# Patient Record
Sex: Female | Born: 1935 | Race: White | Hispanic: No | Smoking: Never smoker
Health system: Southern US, Community
[De-identification: ages and names within clinical notes are randomized; demographics above are authoritative.]

## PROBLEM LIST (undated history)

## (undated) DIAGNOSIS — I10 Essential (primary) hypertension: Secondary | ICD-10-CM

## (undated) DIAGNOSIS — Z9989 Dependence on other enabling machines and devices: Secondary | ICD-10-CM

## (undated) DIAGNOSIS — Z972 Presence of dental prosthetic device (complete) (partial): Secondary | ICD-10-CM

## (undated) DIAGNOSIS — Z973 Presence of spectacles and contact lenses: Secondary | ICD-10-CM

---

## 1992-10-01 HISTORY — PX: HIP SURGERY: SHX245

## 2016-05-09 ENCOUNTER — Other Ambulatory Visit: Payer: Self-pay | Admitting: Endocrinology

## 2016-05-09 DIAGNOSIS — I348 Other nonrheumatic mitral valve disorders: Secondary | ICD-10-CM

## 2016-05-09 DIAGNOSIS — I3489 Other nonrheumatic mitral valve disorders: Secondary | ICD-10-CM

## 2016-05-21 ENCOUNTER — Ambulatory Visit
Admission: RE | Admit: 2016-05-21 | Discharge: 2016-05-21 | Disposition: A | Discharge status: Home / Self Care | Payer: Medicare Other | Source: Ambulatory Visit | Attending: Endocrinology | Admitting: Endocrinology

## 2016-05-21 DIAGNOSIS — I3489 Other nonrheumatic mitral valve disorders: Secondary | ICD-10-CM

## 2016-05-21 DIAGNOSIS — I348 Other nonrheumatic mitral valve disorders: Secondary | ICD-10-CM | POA: Insufficient documentation

## 2016-05-21 NOTE — Progress Notes (Signed)
*  PRELIMINARY RESULTS* Echocardiogram 2D Echocardiogram has been performed.  Cristela BlueHege, Gabriell Daigneault 05/21/2016, 10:38 AM

## 2017-02-22 ENCOUNTER — Encounter: Payer: Self-pay | Admitting: Emergency Medicine

## 2017-02-22 ENCOUNTER — Emergency Department
Admission: EM | Admit: 2017-02-22 | Discharge: 2017-02-22 | Disposition: A | Discharge status: Home / Self Care | Payer: Medicare Other | Attending: Emergency Medicine | Admitting: Emergency Medicine

## 2017-02-22 ENCOUNTER — Emergency Department: Payer: Medicare Other

## 2017-02-22 DIAGNOSIS — M79601 Pain in right arm: Secondary | ICD-10-CM | POA: Diagnosis not present

## 2017-02-22 DIAGNOSIS — R531 Weakness: Secondary | ICD-10-CM

## 2017-02-22 DIAGNOSIS — R5383 Other fatigue: Secondary | ICD-10-CM | POA: Diagnosis not present

## 2017-02-22 LAB — COMPREHENSIVE METABOLIC PANEL
ALBUMIN: 3.9 g/dL (ref 3.5–5.0)
ALT: 15 U/L (ref 14–54)
AST: 28 U/L (ref 15–41)
Alkaline Phosphatase: 78 U/L (ref 38–126)
Anion gap: 4 — ABNORMAL LOW (ref 5–15)
BUN: 13 mg/dL (ref 6–20)
CHLORIDE: 104 mmol/L (ref 101–111)
CO2: 27 mmol/L (ref 22–32)
CREATININE: 0.53 mg/dL (ref 0.44–1.00)
Calcium: 9.5 mg/dL (ref 8.9–10.3)
GFR calc non Af Amer: >60 mL/min (ref >60)
Glucose, Bld: 113 mg/dL — ABNORMAL HIGH (ref 65–99)
Potassium: 3.6 mmol/L (ref 3.5–5.1)
SODIUM: 135 mmol/L (ref 135–145)
Total Bilirubin: 1.8 mg/dL — ABNORMAL HIGH (ref 0.3–1.2)
Total Protein: 7.6 g/dL (ref 6.5–8.1)

## 2017-02-22 LAB — URINALYSIS, COMPLETE (UACMP) WITH MICROSCOPIC
BILIRUBIN URINE: NEGATIVE
GLUCOSE, UA: NEGATIVE mg/dL
KETONES UR: 5 mg/dL — AB
Leukocytes, UA: NEGATIVE
NITRITE: NEGATIVE
PH: 6 (ref 5.0–8.0)
Protein, ur: NEGATIVE mg/dL
Specific Gravity, Urine: 1.005 (ref 1.005–1.030)

## 2017-02-22 LAB — CBC
HCT: 40.3 % (ref 35.0–47.0)
Hemoglobin: 13.5 g/dL (ref 12.0–16.0)
MCH: 29.8 pg (ref 26.0–34.0)
MCHC: 33.6 g/dL (ref 32.0–36.0)
MCV: 88.7 fL (ref 80.0–100.0)
PLATELETS: 168 10*3/uL (ref 150–440)
RBC: 4.55 MIL/uL (ref 3.80–5.20)
RDW: 13.6 % (ref 11.5–14.5)
WBC: 4 10*3/uL (ref 3.6–11.0)

## 2017-02-22 LAB — TROPONIN I: Troponin I: <0.03 ng/mL (ref <0.03)

## 2017-02-22 MED ORDER — SODIUM CHLORIDE 0.9 % IV BOLUS (SEPSIS)
1000.0000 mL | Freq: Once | INTRAVENOUS | Status: AC
  Administered 2017-02-22: 1000 mL via INTRAVENOUS

## 2017-02-22 NOTE — ED Provider Notes (Signed)
-----------------------------------------   8:06 AM on 02/22/2017 -----------------------------------------  Was signed out to be 7:15 this morning, has been suffering from a feeling of dehydration, she has no symptoms. She feels fine. Per signout if her urinalysis is reassuring she is to be discharged. Her family are very eager to go home and requesting discharge at this time. She has no complaints and she is tolerating by mouth. Given this history, we did check her urine, which is reassuring. We will send a culture as a precaution. Patient otherwise is well-appearing. She has no complaints would like to go home. Return precautions and follow-up given and understood.   Jeanmarie PlantMcShane, Navina Wohlers A, MD 02/22/17 [phone]

## 2017-02-22 NOTE — ED Notes (Signed)
Patient reports that recently she is not eating and drinking a lot. Patient reports she does not have an appetite.

## 2017-02-22 NOTE — ED Triage Notes (Signed)
Pt states she has had no appetite for years, states has been feeling weak and shaky today.

## 2017-02-22 NOTE — ED Notes (Signed)
Assisted pt to toilet. Collected urine specimen.

## 2017-02-22 NOTE — ED Provider Notes (Signed)
[facility] Emergency Department Provider Note  Time seen: 4:53 AM  I have reviewed the triage vital signs and the nursing notes.   HISTORY  Chief Complaint Weakness    HPI Denise Escobar is a 83 y.o. female who presents to the emergency department for a fatigue and weakness. According to the patient for the past several days she has been feeling somewhat more fatigued and weak. She also states for the past week she has been expressing intermittent right arm pain although denies any currently. Otherwise the patient appears well with no other complaints at this time. Denies any chest pain or trouble breathing at any point. Denies any abdominal pain nausea vomiting diarrhea cough congestion or fever. Patient states she has not been eating or drinking as much recently and believe she could be dehydrated.  No past medical history on file.  There are no active problems to display for this patient.   No past surgical history on file.  Prior to Admission medications   Not on File    No Known Allergies  No family history on file.  Social History Social History  Substance Use Topics  . Smoking status: Not on file  . Smokeless tobacco: Not on file  . Alcohol use Not on file    Review of Systems Constitutional: Negative for fever.Positive for generalized weakness.  Eyes: Negative for visual changes. ENT: Negative for congestion Cardiovascular: Negative for chest pain. Respiratory: Negative for shortness of breath. Gastrointestinal: Negative for abdominal pain, vomiting and diarrhea. Genitourinary: Negative for dysuria. Musculoskeletal: Negative for back pain. Skin: Negative for rash. Neurological: Negative for headache All other ROS negative  ____________________________________________   PHYSICAL EXAM:  VITAL SIGNS: ED Triage Vitals  Enc Vitals Group     BP 02/22/17 0108 (!) 158/90     Pulse Rate 02/22/17 0108 (!) 102     Resp 02/22/17 0108  20     Temp 02/22/17 0108 98.3 F (36.8 C)     Temp Source 02/22/17 0108 Oral     SpO2 02/22/17 0108 95 %     Weight 02/22/17 0108 90 lb (40.8 kg)     Height --      Head Circumference --      Peak Flow --      Pain Score 02/22/17 0107 7     Pain Loc --      Pain Edu? --      Excl. in GC? --     Constitutional: Alert. Well appearing and in no distress.Friendly and pleasant. Eyes: Normal exam ENT   Head: Normocephalic and atraumatic.   Mouth/Throat: Mucous membranes are moist. Cardiovascular: Normal rate, regular rhythm. No murmur Respiratory: Normal respiratory effort without tachypnea nor retractions. Breath sounds are clear Gastrointestinal: Soft and nontender. No distention. Musculoskeletal: Nontender with normal range of motion in all extremities. No lower extremity tenderness or edema. Good range of motion in all extremities including right upper extremity, nontender to palpation. Neurologic:  Normal speech and language. No gross focal neurologic deficits  Skin:  Skin is warm, dry and intact.  Psychiatric: Mood and affect are normal.   ____________________________________________    EKG  EKG reviewed and interpreted by myself shows sinus tachycardia 106 bpm, narrow QRS, normal axis, normal intervals, nonspecific ST changes but no ST elevation identified.  ____________________________________________    RADIOLOGY  Chest x-ray negative  ____________________________________________   INITIAL IMPRESSION / ASSESSMENT AND PLAN / ED COURSE  Pertinent labs & imaging results that  were available during my care of the patient were reviewed by me and considered in my medical decision making (see chart for details).  Patient presents to the emergency department for generalized weakness for the past several days and a feeling of dehydration. She also states intermittent right arm pain. Overall the patient appears very well with a normal exam for age. Labs are largely  reassuring. Troponin negative. We will IV hydrate and obtain a urinalysis. If negative anticipate likely discharge home.  Patient's blood work is largely within normal limits including a normal troponin. Chest x-ray is reassuring/negative. Patient receiving IV fluids, urinalysis is pending. Patient care signed out to oncoming physician. I anticipate likely discharge home if urinalysis is normal.  ____________________________________________   FINAL CLINICAL IMPRESSION(S) / ED DIAGNOSES  Generalized fatigue    Minna AntisPaduchowski, Oluwasemilore Pascuzzi, MD 02/22/17 [phone]

## 2017-02-25 LAB — URINE CULTURE: Culture: >=100000 — AB

## 2017-02-26 NOTE — Progress Notes (Signed)
ED Antimicrobial Stewardship Positive Culture Follow Up   Denise Escobar is an 83 y.o. female who [facility] to [facility]/22/2017 with a chief complaint of  Chief Complaint  Patient presents with  . Weakness    Recent Results (from the past 720 hour(s))  Urine culture     Status: Abnormal   Collection Time: 02/22/17  7:06 AM  Result Value Ref Range Status   Specimen Description URINE, RANDOM  Final   Special Requests NONE  Final   Culture >=100,000 COLONIES/mL ESCHERICHIA COLI (A)  Final   Report Status 02/25/2017 FINAL  Final   Organism ID, Bacteria ESCHERICHIA COLI (A)  Final      Susceptibility   Escherichia coli - MIC*    AMPICILLIN <=2 SENSITIVE Sensitive     CEFAZOLIN <=4 SENSITIVE Sensitive     CEFTRIAXONE <=1 SENSITIVE Sensitive     CIPROFLOXACIN <=0.25 SENSITIVE Sensitive     GENTAMICIN <=1 SENSITIVE Sensitive     IMIPENEM 0.5 SENSITIVE Sensitive     NITROFURANTOIN <=16 SENSITIVE Sensitive     TRIMETH/SULFA <=20 SENSITIVE Sensitive     AMPICILLIN/SULBACTAM <=2 SENSITIVE Sensitive     PIP/TAZO <=4 SENSITIVE Sensitive     Extended ESBL NEGATIVE Sensitive     * >=100,000 COLONIES/mL ESCHERICHIA COLI    [x]  Patient discharged originally without antimicrobial agent and treatment is now indicated  New antibiotic prescription: amoxicillin 500 mg BID x 7 days  ED Provider: Dr. Roxy CedarPaduchowski  Spoke with patient, counseled on Rx and answered all questions. [facility] [place] [phone]  Horris LatinoHolly Vaida Kerchner, PharmD Pharmacy Resident 02/26/2017 1:39 PM

## 2018-11-05 ENCOUNTER — Emergency Department: Payer: Medicare Other

## 2018-11-05 ENCOUNTER — Inpatient Hospital Stay
Admission: EM | Admit: 2018-11-05 | Discharge: 2018-11-10 | DRG: 689 | Disposition: A | Discharge status: Home Health | Payer: Medicare Other | Attending: Internal Medicine | Admitting: Internal Medicine

## 2018-11-05 ENCOUNTER — Other Ambulatory Visit: Payer: Self-pay

## 2018-11-05 DIAGNOSIS — E785 Hyperlipidemia, unspecified: Secondary | ICD-10-CM | POA: Diagnosis present

## 2018-11-05 DIAGNOSIS — N12 Tubulo-interstitial nephritis, not specified as acute or chronic: Secondary | ICD-10-CM

## 2018-11-05 DIAGNOSIS — F22 Delusional disorders: Secondary | ICD-10-CM | POA: Diagnosis present

## 2018-11-05 DIAGNOSIS — G9340 Encephalopathy, unspecified: Secondary | ICD-10-CM | POA: Diagnosis present

## 2018-11-05 DIAGNOSIS — R443 Hallucinations, unspecified: Secondary | ICD-10-CM | POA: Diagnosis not present

## 2018-11-05 DIAGNOSIS — N10 Acute tubulo-interstitial nephritis: Principal | ICD-10-CM | POA: Diagnosis present

## 2018-11-05 DIAGNOSIS — F05 Delirium due to known physiological condition: Secondary | ICD-10-CM | POA: Diagnosis present

## 2018-11-05 DIAGNOSIS — G9349 Other encephalopathy: Secondary | ICD-10-CM | POA: Diagnosis present

## 2018-11-05 DIAGNOSIS — E43 Unspecified severe protein-calorie malnutrition: Secondary | ICD-10-CM

## 2018-11-05 DIAGNOSIS — F329 Major depressive disorder, single episode, unspecified: Secondary | ICD-10-CM | POA: Diagnosis present

## 2018-11-05 DIAGNOSIS — R410 Disorientation, unspecified: Secondary | ICD-10-CM

## 2018-11-05 DIAGNOSIS — R4182 Altered mental status, unspecified: Secondary | ICD-10-CM

## 2018-11-05 DIAGNOSIS — Z79899 Other long term (current) drug therapy: Secondary | ICD-10-CM

## 2018-11-05 DIAGNOSIS — Z681 Body mass index (BMI) 19 or less, adult: Secondary | ICD-10-CM

## 2018-11-05 DIAGNOSIS — I10 Essential (primary) hypertension: Secondary | ICD-10-CM | POA: Diagnosis present

## 2018-11-05 LAB — TROPONIN I: Troponin I: <0.03 ng/mL (ref <0.03)

## 2018-11-05 LAB — COMPREHENSIVE METABOLIC PANEL
ALBUMIN: 3.5 g/dL (ref 3.5–5.0)
ALT: 17 U/L (ref 0–44)
AST: 23 U/L (ref 15–41)
Alkaline Phosphatase: 56 U/L (ref 38–126)
Anion gap: 9 (ref 5–15)
BUN: 25 mg/dL — AB (ref 8–23)
CALCIUM: 9.7 mg/dL (ref 8.9–10.3)
CO2: 31 mmol/L (ref 22–32)
Chloride: 102 mmol/L (ref 98–111)
Creatinine, Ser: 0.61 mg/dL (ref 0.44–1.00)
GFR calc non Af Amer: >60 mL/min (ref >60)
Glucose, Bld: 105 mg/dL — ABNORMAL HIGH (ref 70–99)
POTASSIUM: 3.5 mmol/L (ref 3.5–5.1)
SODIUM: 142 mmol/L (ref 135–145)
TOTAL PROTEIN: 7.9 g/dL (ref 6.5–8.1)
Total Bilirubin: 1.4 mg/dL — ABNORMAL HIGH (ref 0.3–1.2)

## 2018-11-05 LAB — URINALYSIS, COMPLETE (UACMP) WITH MICROSCOPIC
Bilirubin Urine: NEGATIVE
GLUCOSE, UA: NEGATIVE mg/dL
Ketones, ur: 5 mg/dL — AB
NITRITE: POSITIVE — AB
PH: 5 (ref 5.0–8.0)
Protein, ur: NEGATIVE mg/dL
SPECIFIC GRAVITY, URINE: 1.025 (ref 1.005–1.030)

## 2018-11-05 LAB — CBC
HCT: 45.9 % (ref 36.0–46.0)
HEMOGLOBIN: 14.4 g/dL (ref 12.0–15.0)
MCH: 30.1 pg (ref 26.0–34.0)
MCHC: 31.4 g/dL (ref 30.0–36.0)
MCV: 96 fL (ref 80.0–100.0)
PLATELETS: 252 10*3/uL (ref 150–400)
RBC: 4.78 MIL/uL (ref 3.87–5.11)
RDW: 13.2 % (ref 11.5–15.5)
WBC: 8.3 10*3/uL (ref 4.0–10.5)
nRBC: 0 % (ref 0.0–0.2)

## 2018-11-05 LAB — DIFFERENTIAL
Basophils Absolute: 0 10*3/uL (ref 0.0–0.1)
Basophils Relative: 0 %
EOS PCT: 0 %
Eosinophils Absolute: 0 10*3/uL (ref 0.0–0.5)
Lymphocytes Relative: 16 %
Lymphs Abs: 1.3 10*3/uL (ref 0.7–4.0)
Monocytes Absolute: 0.6 10*3/uL (ref 0.1–1.0)
Monocytes Relative: 7 %
Neutro Abs: 6.3 10*3/uL (ref 1.7–7.7)
Neutrophils Relative %: 77 %

## 2018-11-05 LAB — LIPASE, BLOOD: LIPASE: 77 U/L — AB (ref 11–51)

## 2018-11-05 LAB — LACTIC ACID, PLASMA: Lactic Acid, Venous: 1.1 mmol/L (ref 0.5–1.9)

## 2018-11-05 MED ORDER — IOPAMIDOL (ISOVUE-300) INJECTION 61%
30.0000 mL | Freq: Once | INTRAVENOUS | Status: AC | PRN
  Administered 2018-11-05: 30 mL via ORAL

## 2018-11-05 MED ORDER — SODIUM CHLORIDE 0.9% FLUSH: 3.0000 mL | Freq: Once | INTRAVENOUS | Status: DC

## 2018-11-05 MED ORDER — IOPAMIDOL (ISOVUE-300) INJECTION 61%
75.0000 mL | Freq: Once | INTRAVENOUS | Status: AC | PRN
  Administered 2018-11-05: 75 mL via INTRAVENOUS

## 2018-11-05 MED ORDER — SODIUM CHLORIDE 0.9 % IV SOLN
Freq: Once | INTRAVENOUS | Status: AC
  Administered 2018-11-05: via INTRAVENOUS

## 2018-11-05 NOTE — ED Triage Notes (Addendum)
Pt comes via POV from home with c/o AMS per son. Son states this started Saturday.   Son states pt has been talking about getting AIDS from trying on clothes and people coming to kill their cats.  Pt denies any hallucinations or hearing voices. Pt denies any pain or urinary symptoms.  Pt is alert to self and DOB. Pt alert to place.

## 2018-11-05 NOTE — ED Provider Notes (Signed)
[facility] Emergency Department Provider Note   ____________________________________________   First MD Initiated Contact with Patient 11/05/18 1818     (approximate)  I have reviewed the triage vital signs and the nursing notes.   HISTORY  Chief Complaint Altered Mental Status    HPI Denise Escobar is a 85 y.o. female who was brought in by her son.  Her son's cell phone is [phone].  He requests updates.  He has to go home and take a nap because he is on him fall asleep.  He reports that she she has been slowly losing weight and not eating very much for some time.  But she was in her usual normal mental status.  On Saturday she suddenly became very paranoid thinking that she would catch aids from trying on clothes at the [employer] and given to her family.  She was thinking her cats would have aids.  Here she is heard by the triage nursing and she thinks people are going to steal her shoes.  This is unusual for her.  History reviewed. No pertinent past medical history.  There are no active problems to display for this patient.   Past Surgical History:  Procedure Laterality Date  . HIP SURGERY  1994    Prior to Admission medications   Medication Sig Start Date End Date Taking? Authorizing Provider  calcium-vitamin D (OSCAL WITH D) 250-125 MG-UNIT tablet Take 1 tablet by mouth daily.   Yes [provider]  ezetimibe-simvastatin (VYTORIN) 10-40 MG tablet Take 0.5 tablets by mouth daily. 10/25/18  Yes [provider]  montelukast (SINGULAIR) 10 MG tablet Take 10 mg by mouth daily. 02/14/17  Yes [provider]  TOPROL XL 50 MG 24 hr tablet Take 25 mg by mouth daily. 02/14/17  Yes [provider]  Vitamin D, Ergocalciferol, (DRISDOL) 1.25 MG (50000 UT) CAPS capsule Take 50,000 Units by mouth daily. 09/11/18  Yes [provider]  gabapentin (NEURONTIN) 600 MG tablet Take 600 mg by mouth 2 (two) times daily.  02/14/17   [provider]    Allergies Patient has no known allergies.  No family history on file.  Social History Social History   Tobacco Use  . Smoking status: Not on file  Substance Use Topics  . Alcohol use: Not on file  . Drug use: Not on file    Review of Systems  Constitutional: No fever/chills Eyes: No visual changes. ENT: No sore throat. Cardiovascular: Denies chest pain. Respiratory: Denies shortness of breath. Gastrointestinal: No abdominal pain.  No nausea, no vomiting.  No diarrhea.  No constipation. Genitourinary: Negative for dysuria. Musculoskeletal: Negative for back pain. Skin: Negative for rash. Neurological: Negative for headaches, focal weakness     ____________________________________________   PHYSICAL EXAM:  VITAL SIGNS: ED Triage Vitals  Enc Vitals Group     BP 11/05/18 1617 (!) 146/85     Pulse Rate 11/05/18 1617 (!) 110     Resp 11/05/18 1617 18     Temp 11/05/18 1617 98.5 F (36.9 C)     Temp Source 11/05/18 1617 Oral     SpO2 11/05/18 1617 95 %     Weight 11/05/18 1617 87 lb (39.5 kg)     Height 11/05/18 1617 5\' 3"  (1.6 m)     Head Circumference --      Peak Flow --      Pain Score 11/05/18 1630 0     Pain Loc --  Pain Edu? --      Excl. in GC? --     Constitutional: Alert and oriented.  Nose dehydrated/ketotic. Eyes: Conjunctivae are normal.  Pupils are equal round reactive to light but small.  Extraocular movements are intact. Head: Atraumatic. Nose: No congestion/rhinnorhea. Mouth/Throat: Mucous membranes are on the dry side oropharynx non-erythematous. Neck: No stridor.   Cardiovascular: Normal rate, regular rhythm. Grossly normal heart sounds.  Good peripheral circulation. Respiratory: Normal respiratory effort.  No retractions. Lungs CTAB. Gastrointestinal: Soft and nontender. No distention. No abdominal bruits. No CVA tenderness. Musculoskeletal: No lower extremity tenderness nor edema. Neurologic:   Normal speech and language. No gross focal neurologic deficits are appreciated.. Skin:  Skin is warm, dry and intact. No rash noted.   ____________________________________________   LABS (all labs ordered are listed, but only abnormal results are displayed)  Labs Reviewed  COMPREHENSIVE METABOLIC PANEL - Abnormal; Notable for the following components:      Result Value   Glucose, Bld 105 (*)    BUN 25 (*)    Total Bilirubin 1.4 (*)    All other components within normal limits  LIPASE, BLOOD - Abnormal; Notable for the following components:   Lipase 77 (*)    All other components within normal limits  CBC  TROPONIN I  LACTIC ACID, PLASMA  DIFFERENTIAL  URINALYSIS, COMPLETE (UACMP) WITH MICROSCOPIC  LACTIC ACID, PLASMA  CBG MONITORING, ED   ____________________________________________  EKG EKG read interpreted by me shows normal sinus rhythm rate of 80 normal axis no acute ST-T wave changes very very poor baseline.  Computer is reading ST segment ovation inferiorly I do not see it.  ____________________________________________  RADIOLOGY  ED MD interpretation: Chest x-ray read by radiology reviewed by me shows only hyperinflation.  Official radiology report(s): Ct Head Wo Contrast  Result Date: 11/05/2018 CLINICAL DATA:  Unexplained alteration of consciousness. EXAM: CT HEAD WITHOUT CONTRAST TECHNIQUE: Contiguous axial images were obtained from the base of the skull through the vertex without intravenous contrast. COMPARISON:  None. FINDINGS: Brain: Moderate to advanced ventriculomegaly, with disproportionate lack of prominence of the cortical sulci. Basilar cisterns are patulous. It is unclear if this pattern represents central atrophy, or normal pressure hydrocephalus. No acute or chronic cortical infarct. No mass lesion, hemorrhage, or extra-axial fluid. Vascular: Calcification of the cavernous internal carotid arteries consistent with cerebrovascular atherosclerotic disease.  No signs of intracranial large vessel occlusion. Skull: Calvarium intact. Sinuses/Orbits: Sinuses are clear. Negative orbits. Other: None. IMPRESSION: 1. Moderate to advanced ventriculomegaly. See discussion above. Findings could represent either central atrophy or normal pressure hydrocephalus. 2. No acute intracranial findings. Electronically Signed   By: Elsie Stain M.D.   On: 11/05/2018 19:01   Ct Abdomen Pelvis W Contrast  Result Date: 11/05/2018 CLINICAL DATA:  Unintended weight loss. The ordering physician indicated non-localized abdominal pain. EXAM: CT ABDOMEN AND PELVIS WITH CONTRAST TECHNIQUE: Multidetector CT imaging of the abdomen and pelvis was performed using the standard protocol following bolus administration of intravenous contrast. CONTRAST:  75mL ISOVUE-300 IOPAMIDOL (ISOVUE-300) INJECTION 61% COMPARISON:  None. FINDINGS: Lower chest: Moderate-sized hiatal hernia. Hyperexpanded lungs. 6.1 x 3.7 mm anterior left lower lobe nodule on image number 3 series 4. 3 mm right middle lobe nodule on image number 2 series 4. 1 mm right middle lobe nodule on image number 1 series 4. 3 mm right middle lobe nodule on image number 2 series 4. 3 mm right middle lobe nodule on image number 8 series 4. 4 mm right  lower lobe nodule on image number 8 series 4. 3 mm right upper lobe nodule on image number 1 series 4. 4 mm left lower lobe nodule on image number 8 series 4. Hepatobiliary: Poorly distended gallbladder with moderate diffuse wall thickening and enhancement. No pericholecystic fluid. Unremarkable liver. Pancreas: Unremarkable. No pancreatic ductal dilatation or surrounding inflammatory changes. Spleen: Normal in size without focal abnormality. Adrenals/Urinary Tract: 4 mm lower pole right renal calculus and additional tiny lower pole right renal calculus. Small lower pole right renal cortical cyst. Mild patchy enhancement of the right kidney compared to the left kidney on the initial images, seen on  the delayed images. Unremarkable visualized portion of the urinary bladder and ureters. There is streak artifact in the inferior pelvis by a right hip prosthesis obscuring portions of the bladder and distal ureters. Unremarkable adrenal glands. Stomach/Bowel: Moderate-sized hiatal hernia. Unremarkable small bowel, colon and appendix. Vascular/Lymphatic: Atheromatous arterial calcifications without aneurysm. No enlarged lymph nodes. Reproductive: Uterus and bilateral adnexa are unremarkable. Other: No abdominal wall hernia or abnormality. No abdominopelvic ascites. Musculoskeletal: Right hip prosthesis. Compression deformities of all of the lumbar and lower thoracic vertebrae. This is most pronounced at the L1 level, where there is a 90% compression deformity with some sclerosis, mild to moderate bony retropulsion and no acute fracture lines. There is moderate spinal stenosis at that level. IMPRESSION: 1. Mild patchy enhancement of the right kidney compared to the left kidney on the initial images. This could be due to mild pyelonephritis. 2. Contracted gallbladder with moderate diffuse wall thickening and enhancement. This could be due to acute or chronic cholecystitis. 3. Small, nonobstructing right renal calculi. 4. Moderate-sized hiatal hernia. 5. Multiple bilateral lung nodules, as described above. No follow-up needed if patient is low-risk (and has no known or suspected primary neoplasm). Non-contrast chest CT can be considered in 12 months if patient is high-risk. This recommendation follows the consensus statement: Guidelines for Management of Incidental Pulmonary Nodules Detected on CT Images: From the Fleischner Society 2017; Radiology 2017; 284:228-243. 6. Changes of COPD with centrilobular emphysema. 7. Compression deformities of all of the lumbar and lower thoracic vertebrae, most pronounced at the L1 level, with bony retropulsion at the level causing moderate spinal stenosis. Emphysema (ICD10-J43.9).  Electronically Signed   By: Beckie Salts M.D.   On: 11/05/2018 20:54   Dg Chest Portable 1 View  Result Date: 11/05/2018 CLINICAL DATA:  85 year old female with altered mental status for 3 days. EXAM: PORTABLE CHEST 1 VIEW COMPARISON:  Chest radiographs 02/22/2017. FINDINGS: Portable AP upright view at 1825 hours. Mediastinal contours remain normal. Chronically large lung volumes. Chronic biapical scarring. No pneumothorax, pulmonary edema, pleural effusion or confluent pulmonary opacity. No acute osseous abnormality identified. Negative visible bowel gas pattern. IMPRESSION: 1.  No acute cardiopulmonary abnormality. 2. Chronic pulmonary hyperinflation and apical scarring. Electronically Signed   By: Odessa Fleming M.D.   On: 11/05/2018 18:48    ____________________________________________   PROCEDURES  Procedure(s) performed:   Procedures  Critical Care performed:   ____________________________________________   INITIAL IMPRESSION / ASSESSMENT AND PLAN / ED COURSE  Urinalysis pending if patient has UTI with the possible [place] on CT will plan on admitting her.  Otherwise we may keep her in the ER till neurology and psychiatry can see her.  Signed out to oncoming physician      Clinical Course as of Nov 06 2335  Wed Nov 05, 2018  2058 Lipase, blood(!) [PM]    Clinical Course User Index [  PM] Arnaldo Natal, MD     ____________________________________________   FINAL CLINICAL IMPRESSION(S) / ED DIAGNOSES  Final diagnoses:  Altered mental status, unspecified altered mental status type     ED Discharge Orders    None       Note:  This document was prepared using Dragon voice recognition software and may include unintentional dictation errors.    Arnaldo Natal, MD 11/05/18 2337

## 2018-11-05 NOTE — ED Notes (Signed)
Patient transported to CT 

## 2018-11-06 DIAGNOSIS — N10 Acute tubulo-interstitial nephritis: Secondary | ICD-10-CM | POA: Diagnosis present

## 2018-11-06 DIAGNOSIS — G9340 Encephalopathy, unspecified: Secondary | ICD-10-CM | POA: Diagnosis present

## 2018-11-06 DIAGNOSIS — E785 Hyperlipidemia, unspecified: Secondary | ICD-10-CM | POA: Diagnosis present

## 2018-11-06 DIAGNOSIS — R443 Hallucinations, unspecified: Secondary | ICD-10-CM | POA: Diagnosis not present

## 2018-11-06 DIAGNOSIS — Z681 Body mass index (BMI) 19 or less, adult: Secondary | ICD-10-CM | POA: Diagnosis not present

## 2018-11-06 DIAGNOSIS — F05 Delirium due to known physiological condition: Secondary | ICD-10-CM | POA: Diagnosis present

## 2018-11-06 DIAGNOSIS — F22 Delusional disorders: Secondary | ICD-10-CM | POA: Diagnosis present

## 2018-11-06 DIAGNOSIS — G9349 Other encephalopathy: Secondary | ICD-10-CM | POA: Diagnosis present

## 2018-11-06 DIAGNOSIS — R410 Disorientation, unspecified: Secondary | ICD-10-CM | POA: Diagnosis not present

## 2018-11-06 DIAGNOSIS — F329 Major depressive disorder, single episode, unspecified: Secondary | ICD-10-CM | POA: Diagnosis present

## 2018-11-06 DIAGNOSIS — I10 Essential (primary) hypertension: Secondary | ICD-10-CM | POA: Diagnosis present

## 2018-11-06 DIAGNOSIS — Z79899 Other long term (current) drug therapy: Secondary | ICD-10-CM | POA: Diagnosis not present

## 2018-11-06 DIAGNOSIS — E43 Unspecified severe protein-calorie malnutrition: Secondary | ICD-10-CM | POA: Diagnosis present

## 2018-11-06 LAB — BASIC METABOLIC PANEL
Anion gap: 5 (ref 5–15)
BUN: 16 mg/dL (ref 8–23)
CO2: 32 mmol/L (ref 22–32)
Calcium: 8.1 mg/dL — ABNORMAL LOW (ref 8.9–10.3)
Chloride: 103 mmol/L (ref 98–111)
Creatinine, Ser: 0.42 mg/dL — ABNORMAL LOW (ref 0.44–1.00)
GFR calc Af Amer: >60 mL/min (ref >60)
GFR calc non Af Amer: >60 mL/min (ref >60)
Glucose, Bld: 94 mg/dL (ref 70–99)
Potassium: 3.1 mmol/L — ABNORMAL LOW (ref 3.5–5.1)
Sodium: 140 mmol/L (ref 135–145)

## 2018-11-06 LAB — CBC
HCT: 35.8 % — ABNORMAL LOW (ref 36.0–46.0)
HEMOGLOBIN: 11.2 g/dL — AB (ref 12.0–15.0)
MCH: 30.2 pg (ref 26.0–34.0)
MCHC: 31.3 g/dL (ref 30.0–36.0)
MCV: 96.5 fL (ref 80.0–100.0)
Platelets: 202 10*3/uL (ref 150–400)
RBC: 3.71 MIL/uL — ABNORMAL LOW (ref 3.87–5.11)
RDW: 12.8 % (ref 11.5–15.5)
WBC: 6 10*3/uL (ref 4.0–10.5)
nRBC: 0 % (ref 0.0–0.2)

## 2018-11-06 LAB — MAGNESIUM: Magnesium: 1.7 mg/dL (ref 1.7–2.4)

## 2018-11-06 LAB — GLUCOSE, CAPILLARY: GLUCOSE-CAPILLARY: 70 mg/dL (ref 70–99)

## 2018-11-06 LAB — LACTIC ACID, PLASMA: Lactic Acid, Venous: 0.8 mmol/L (ref 0.5–1.9)

## 2018-11-06 MED ORDER — ONDANSETRON HCL 4 MG/2ML IJ SOLN: 4.0000 mg | Freq: Four times a day (QID) | INTRAMUSCULAR | Status: DC | PRN

## 2018-11-06 MED ORDER — MONTELUKAST SODIUM 10 MG PO TABS
10.0000 mg | ORAL_TABLET | Freq: Every day | ORAL | Status: DC
  Administered 2018-11-06 – 2018-11-10 (×5): 10 mg via ORAL
  Filled 2018-11-06 (×5): qty 1

## 2018-11-06 MED ORDER — ONDANSETRON HCL 4 MG PO TABS: 4.0000 mg | ORAL_TABLET | Freq: Four times a day (QID) | ORAL | Status: DC | PRN

## 2018-11-06 MED ORDER — ENOXAPARIN SODIUM 40 MG/0.4ML ~~LOC~~ SOLN: 40.0000 mg | SUBCUTANEOUS | Status: DC

## 2018-11-06 MED ORDER — ACETAMINOPHEN 650 MG RE SUPP: 650.0000 mg | Freq: Four times a day (QID) | RECTAL | Status: DC | PRN

## 2018-11-06 MED ORDER — MAGNESIUM SULFATE IN D5W 1-5 GM/100ML-% IV SOLN
1.0000 g | Freq: Once | INTRAVENOUS | Status: AC
  Administered 2018-11-06: 1 g via INTRAVENOUS
  Filled 2018-11-06: qty 100

## 2018-11-06 MED ORDER — ACETAMINOPHEN 325 MG PO TABS
650.0000 mg | ORAL_TABLET | Freq: Four times a day (QID) | ORAL | Status: DC | PRN
  Administered 2018-11-06: 21:00:00 650 mg via ORAL
  Filled 2018-11-06: qty 2

## 2018-11-06 MED ORDER — SODIUM CHLORIDE 0.9 % IV SOLN
1.0000 g | INTRAVENOUS | Status: DC
  Administered 2018-11-06 – 2018-11-07 (×2): 1 g via INTRAVENOUS
  Filled 2018-11-06: qty 10
  Filled 2018-11-06: qty 1
  Filled 2018-11-06: qty 10

## 2018-11-06 MED ORDER — POTASSIUM CHLORIDE CRYS ER 20 MEQ PO TBCR
40.0000 meq | EXTENDED_RELEASE_TABLET | Freq: Two times a day (BID) | ORAL | Status: DC
  Administered 2018-11-06 (×2): 40 meq via ORAL
  Filled 2018-11-06 (×3): qty 2

## 2018-11-06 MED ORDER — METOPROLOL SUCCINATE ER 25 MG PO TB24
25.0000 mg | ORAL_TABLET | Freq: Every day | ORAL | Status: DC
  Administered 2018-11-06 – 2018-11-09 (×4): 25 mg via ORAL
  Filled 2018-11-06 (×4): qty 1

## 2018-11-06 MED ORDER — ENOXAPARIN SODIUM 30 MG/0.3ML ~~LOC~~ SOLN
30.0000 mg | SUBCUTANEOUS | Status: DC
  Administered 2018-11-06 – 2018-11-09 (×4): 30 mg via SUBCUTANEOUS
  Filled 2018-11-06 (×4): qty 0.3

## 2018-11-06 NOTE — Care Management Note (Addendum)
Case Management Note  Patient Details  Name: Denise Escobar MRN: [id number] Date of Birth: [birth year]  Subjective/Objective:   Admitted to [facility] with the diagnosis of acute pyelonephritis. Lives with son, Marcial Pacas and daughter - in Social worker. Last seen Dr. Gillermina Phy 1-2 months ago. [facility] [place] or [place].  No home Health. No skilled facility. No home oxygen. Cane and rollayor in the home. Takes care of all basic activities of daily living herself, doesn't drive. Family helps with errands.   No falls. Fair-good appetite.                 Action/Plan: Physical therapy is recommending home with home health and therapy.  Medicare Home Health government query, One copy to patient. One placed on chart. Discussed home health agencies. Doesn't want to make a decision at this time.   Expected Discharge Date:                  Expected Discharge Plan:     In-House Referral:   yes  Discharge planning Services     Post Acute Care Choice:   yes Choice offered to:   patient  DME Arranged:    DME Agency:     HH Arranged:    HH Agency:     Status of Service:     If discussed at [employer], dates discussed:    Additional Comments:  Gwenette Greet, RN MSN CCM Care Management [phone] 11/06/2018, 1:39 PM

## 2018-11-06 NOTE — BH Assessment (Signed)
Assessment Note  Denise Escobar is an 85 y.o. female is being admitted to the medical floor for a UTI. Pt is unable to complete assessment and not yet medically cleared.   Diagnosis: Altered mental status.   Past Medical History: History reviewed. No pertinent past medical history.  Past Surgical History:  Procedure Laterality Date  . HIP SURGERY  1994    Family History: No family history on file.  Social History:  has no history on file for tobacco, alcohol, and drug.  Additional Social History:     CIWA: CIWA-Ar BP: (!) 114/58 Pulse Rate: 82 COWS:    Allergies: No Known Allergies  Home Medications: (Not in a hospital admission)   OB/GYN Status:  No LMP recorded. Patient is postmenopausal.  General Assessment Data Assessment unable to be completed: Yes Reason for not completing assessment: Pt is being admitted to the medical floor for severe UTI.Marland Kitchen Pt is extremely hard of hearing and not able to complete assessment.                                                  Advance Directives (For Healthcare) Does Patient Have a Medical Advance Directive?: No          Disposition:     On Site Evaluation by:   Reviewed with Physician:    Alroy Portela D Danaly Bari 11/06/2018 2:04 AM

## 2018-11-06 NOTE — Progress Notes (Signed)
Patient admitted early this morning.  Seen and examined by me later in the morning.  Patient states she feels well this morning.  She was alert and answering questions appropriately.  Her altered mental status has completely resolved.   On exam, she has a regular rate and rhythm.  Lungs clear to auscultation bilaterally.  No abdominal tenderness to palpation.  -Urine culture pending -Continue ceftriaxone -Replete potassium -PT consulted -Plan for likely discharge tomorrow  Willadean Carol, MD

## 2018-11-06 NOTE — Progress Notes (Signed)
Lovenox changed to 30 mg daily for TBW <45kg. 

## 2018-11-06 NOTE — ED Notes (Signed)
ED TO INPATIENT HANDOFF REPORT  Name/Age/Gender Denise Escobar 85 y.o. female  Code Status   Home/SNF/Other Home  Chief Complaint ams  Level of Care/Admitting Diagnosis ED Disposition    ED Disposition Condition Comment   Admit  Hospital Area: [facility] [[id number]]  Level of Care: Med-Surg [16]  Diagnosis: Acute encephalopathy [161096]  Admitting Physician: Oralia Manis [[id number]]  Attending Physician: Oralia Manis [phone]  Estimated length of stay: past midnight tomorrow  Certification:: I certify this patient will need inpatient services for at least 2 midnights  PT Class (Do Not Modify): Inpatient [101]  PT Acc Code (Do Not Modify): Private [1]       Medical History History reviewed. No pertinent past medical history.  Allergies No Known Allergies  IV Location/Drains/Wounds Patient Lines/Drains/Airways Status   Active Line/Drains/Airways    Name:   Placement date:   Placement time:   Site:   Days:   Peripheral IV 02/22/17 Left Forearm   02/22/17    0509    Forearm   622   Peripheral IV 11/05/18 Right Antecubital   11/05/18    2004    Antecubital   1          Labs/Imaging Results for orders placed or performed during the hospital encounter of 11/05/18 (from the past 48 hour(s))  Comprehensive metabolic panel     Status: Abnormal   Collection Time: 11/05/18  4:24 PM  Result Value Ref Range   Sodium 142 135 - 145 mmol/L   Potassium 3.5 3.5 - 5.1 mmol/L   Chloride 102 98 - 111 mmol/L   CO2 31 22 - 32 mmol/L   Glucose, Bld 105 (H) 70 - 99 mg/dL   BUN 25 (H) 8 - 23 mg/dL   Creatinine, Ser 2.95 0.44 - 1.00 mg/dL   Calcium 9.7 8.9 - 62.1 mg/dL   Total Protein 7.9 6.5 - 8.1 g/dL   Albumin 3.5 3.5 - 5.0 g/dL   AST 23 15 - 41 U/L   ALT 17 0 - 44 U/L   Alkaline Phosphatase 56 38 - 126 U/L   Total Bilirubin 1.4 (H) 0.3 - 1.2 mg/dL   GFR calc non Af Amer >60 >60 mL/min   GFR calc Af Amer >60 >60 mL/min   Anion gap 9 5 - 15    Comment:  Performed at [facility], [address] [place], [place] [address]  CBC     Status: None   Collection Time: 11/05/18  4:24 PM  Result Value Ref Range   WBC 8.3 4.0 - 10.5 K/uL   RBC 4.78 3.87 - 5.11 MIL/uL   Hemoglobin 14.4 12.0 - 15.0 g/dL   HCT 78.4 69.6 - 29.5 %   MCV 96.0 80.0 - 100.0 fL   MCH 30.1 26.0 - 34.0 pg   MCHC 31.4 30.0 - 36.0 g/dL   RDW 28.4 13.2 - 44.0 %   Platelets 252 150 - 400 K/uL   nRBC 0.0 0.0 - 0.2 %    Comment: Performed at [facility], [address] [place], [place] [address]  Lipase, blood     Status: Abnormal   Collection Time: 11/05/18  4:24 PM  Result Value Ref Range   Lipase 77 (H) 11 - 51 U/L    Comment: Performed at [facility], [address], [place], [place] [address]  Differential     Status: None   Collection Time: 11/05/18  4:24 PM  Result Value Ref  Range   Neutrophils Relative % 77 %   Neutro Abs 6.3 1.7 - 7.7 K/uL   Lymphocytes Relative 16 %   Lymphs Abs 1.3 0.7 - 4.0 K/uL   Monocytes Relative 7 %   Monocytes Absolute 0.6 0.1 - 1.0 K/uL   Eosinophils Relative 0 %   Eosinophils Absolute 0.0 0.0 - 0.5 K/uL   Basophils Relative 0 %   Basophils Absolute 0.0 0.0 - 0.1 K/uL    Comment: Performed at [facility], [address] [place], [place] [address]  Troponin I - Once     Status: None   Collection Time: 11/05/18  6:21 PM  Result Value Ref Range   Troponin I <0.03 <0.03 ng/mL    Comment: Performed at [facility], [address] [place], [place] [address]  Lactic acid, plasma     Status: None   Collection Time: 11/05/18  8:03 PM  Result Value Ref Range   Lactic Acid, Venous 1.1 0.5 - 1.9 mmol/L    Comment: Performed at [facility], [address] [place], [place] [address]  Urinalysis, Complete w Microscopic     Status: Abnormal   Collection Time: 11/05/18 11:32 PM  Result Value Ref Range   Color, Urine YELLOW (A) YELLOW   APPearance HAZY (A) CLEAR   Specific  Gravity, Urine 1.025 1.005 - 1.030   pH 5.0 5.0 - 8.0   Glucose, UA NEGATIVE NEGATIVE mg/dL   Hgb urine dipstick SMALL (A) NEGATIVE   Bilirubin Urine NEGATIVE NEGATIVE   Ketones, ur 5 (A) NEGATIVE mg/dL   Protein, ur NEGATIVE NEGATIVE mg/dL   Nitrite POSITIVE (A) NEGATIVE   Leukocytes, UA SMALL (A) NEGATIVE   RBC / HPF 0-5 0 - 5 RBC/hpf   WBC, UA 21-50 0 - 5 WBC/hpf   Bacteria, UA RARE (A) NONE SEEN   Squamous Epithelial / LPF 0-5 0 - 5   Mucus PRESENT     Comment: Performed at [facility], [address] [place], [place] [address]  Lactic acid, plasma     Status: None   Collection Time: 11/06/18 12:31 AM  Result Value Ref Range   Lactic Acid, Venous 0.8 0.5 - 1.9 mmol/L    Comment: Performed at [facility], [address] [place], [place] [address]  Glucose, capillary     Status: None   Collection Time: 11/06/18 12:36 AM  Result Value Ref Range   Glucose-Capillary 70 70 - 99 mg/dL   Ct Head Wo Contrast  Result Date: 11/05/2018 CLINICAL DATA:  Unexplained alteration of consciousness. EXAM: CT HEAD WITHOUT CONTRAST TECHNIQUE: Contiguous axial images were obtained from the base of the skull through the vertex without intravenous contrast. COMPARISON:  None. FINDINGS: Brain: Moderate to advanced ventriculomegaly, with disproportionate lack of prominence of the cortical sulci. Basilar cisterns are patulous. It is unclear if this pattern represents central atrophy, or normal pressure hydrocephalus. No acute or chronic cortical infarct. No mass lesion, hemorrhage, or extra-axial fluid. Vascular: Calcification of the cavernous internal carotid arteries consistent with cerebrovascular atherosclerotic disease. No signs of intracranial large vessel occlusion. Skull: Calvarium intact. Sinuses/Orbits: Sinuses are clear. Negative orbits. Other: None. IMPRESSION: 1. Moderate to advanced ventriculomegaly. See discussion above. Findings could represent either central atrophy or  normal pressure hydrocephalus. 2. No acute intracranial findings. Electronically Signed   By: Elsie Stain M.D.   On: 11/05/2018 19:01   Ct Abdomen Pelvis W Contrast  Result Date: 11/05/2018 CLINICAL DATA:  Unintended weight  loss. The ordering physician indicated non-localized abdominal pain. EXAM: CT ABDOMEN AND PELVIS WITH CONTRAST TECHNIQUE: Multidetector CT imaging of the abdomen and pelvis was performed using the standard protocol following bolus administration of intravenous contrast. CONTRAST:  75mL ISOVUE-300 IOPAMIDOL (ISOVUE-300) INJECTION 61% COMPARISON:  None. FINDINGS: Lower chest: Moderate-sized hiatal hernia. Hyperexpanded lungs. 6.1 x 3.7 mm anterior left lower lobe nodule on image number 3 series 4. 3 mm right middle lobe nodule on image number 2 series 4. 1 mm right middle lobe nodule on image number 1 series 4. 3 mm right middle lobe nodule on image number 2 series 4. 3 mm right middle lobe nodule on image number 8 series 4. 4 mm right lower lobe nodule on image number 8 series 4. 3 mm right upper lobe nodule on image number 1 series 4. 4 mm left lower lobe nodule on image number 8 series 4. Hepatobiliary: Poorly distended gallbladder with moderate diffuse wall thickening and enhancement. No pericholecystic fluid. Unremarkable liver. Pancreas: Unremarkable. No pancreatic ductal dilatation or surrounding inflammatory changes. Spleen: Normal in size without focal abnormality. Adrenals/Urinary Tract: 4 mm lower pole right renal calculus and additional tiny lower pole right renal calculus. Small lower pole right renal cortical cyst. Mild patchy enhancement of the right kidney compared to the left kidney on the initial images, seen on the delayed images. Unremarkable visualized portion of the urinary bladder and ureters. There is streak artifact in the inferior pelvis by a right hip prosthesis obscuring portions of the bladder and distal ureters. Unremarkable adrenal glands. Stomach/Bowel:  Moderate-sized hiatal hernia. Unremarkable small bowel, colon and appendix. Vascular/Lymphatic: Atheromatous arterial calcifications without aneurysm. No enlarged lymph nodes. Reproductive: Uterus and bilateral adnexa are unremarkable. Other: No abdominal wall hernia or abnormality. No abdominopelvic ascites. Musculoskeletal: Right hip prosthesis. Compression deformities of all of the lumbar and lower thoracic vertebrae. This is most pronounced at the L1 level, where there is a 90% compression deformity with some sclerosis, mild to moderate bony retropulsion and no acute fracture lines. There is moderate spinal stenosis at that level. IMPRESSION: 1. Mild patchy enhancement of the right kidney compared to the left kidney on the initial images. This could be due to mild pyelonephritis. 2. Contracted gallbladder with moderate diffuse wall thickening and enhancement. This could be due to acute or chronic cholecystitis. 3. Small, nonobstructing right renal calculi. 4. Moderate-sized hiatal hernia. 5. Multiple bilateral lung nodules, as described above. No follow-up needed if patient is low-risk (and has no known or suspected primary neoplasm). Non-contrast chest CT can be considered in 12 months if patient is high-risk. This recommendation follows the consensus statement: Guidelines for Management of Incidental Pulmonary Nodules Detected on CT Images: From the Fleischner Society 2017; Radiology 2017; 284:228-243. 6. Changes of COPD with centrilobular emphysema. 7. Compression deformities of all of the lumbar and lower thoracic vertebrae, most pronounced at the L1 level, with bony retropulsion at the level causing moderate spinal stenosis. Emphysema (ICD10-J43.9). Electronically Signed   By: Beckie SaltsSteven  Reid M.D.   On: 11/05/2018 20:54   Dg Chest Portable 1 View  Result Date: 11/05/2018 CLINICAL DATA:  85 year old female with altered mental status for 3 days. EXAM: PORTABLE CHEST 1 VIEW COMPARISON:  Chest radiographs  02/22/2017. FINDINGS: Portable AP upright view at 1825 hours. Mediastinal contours remain normal. Chronically large lung volumes. Chronic biapical scarring. No pneumothorax, pulmonary edema, pleural effusion or confluent pulmonary opacity. No acute osseous abnormality identified. Negative visible bowel gas pattern. IMPRESSION: 1.  No acute cardiopulmonary abnormality.  2. Chronic pulmonary hyperinflation and apical scarring. Electronically Signed   By: Odessa FlemingH  Hall M.D.   On: 11/05/2018 18:48    Pending Labs Unresulted Labs (From admission, onward)    Start     Ordered   11/06/18 0040  Urine Culture  Add-on,   AD    Question:  Patient immune status  Answer:  Normal   11/06/18 0039   Signed and Held  CBC  (enoxaparin (LOVENOX)    CrCl >/= 30 ml/min)  Once,   R    Comments:  Baseline for enoxaparin therapy IF NOT ALREADY DRAWN.  Notify MD if PLT < 100 K.    Signed and Held   Signed and Held  Creatinine, serum  (enoxaparin (LOVENOX)    CrCl >/= 30 ml/min)  Once,   R    Comments:  Baseline for enoxaparin therapy IF NOT ALREADY DRAWN.    Signed and Held   Signed and Held  Creatinine, serum  (enoxaparin (LOVENOX)    CrCl >/= 30 ml/min)  Weekly,   R    Comments:  while on enoxaparin therapy    Signed and Held   Signed and Held  Basic metabolic panel  Tomorrow morning,   R     Signed and Held   Signed and Held  CBC  Tomorrow morning,   R     Signed and Held          Vitals/Pain Today's Vitals   11/05/18 1630 11/05/18 1949 11/05/18 2346 11/06/18 0144  BP:  (!) 154/83 (!) 143/82 (!) 114/58  Pulse:  78 86 82  Resp:  14 17 (!) 21  Temp:    98 F (36.7 C)  TempSrc:    Oral  SpO2:  98% 96% 96%  Weight:      Height:      PainSc: 0-No pain 0-No pain  0-No pain    Isolation Precautions No active isolations  Medications Medications  sodium chloride flush (NS) 0.9 % injection 3 mL (has no administration in time range)  cefTRIAXone (ROCEPHIN) 1 g in sodium chloride 0.9 % 100 mL IVPB (1 g  Intravenous New Bag/Given 11/06/18 0142)  iopamidol (ISOVUE-300) 61 % injection 30 mL (30 mLs Oral Contrast Given 11/05/18 1853)  iopamidol (ISOVUE-300) 61 % injection 75 mL (75 mLs Intravenous Contrast Given 11/05/18 2015)  0.9 %  sodium chloride infusion ( Intravenous New Bag/Given 11/05/18 2341)    Mobility walks with person assist

## 2018-11-06 NOTE — H&P (Signed)
[facility] [place] [facility]   PATIENT NAME: Denise CleaverMamie Escobar    MR#:  [id number]  DATE OF BIRTH:  07/08/1932  DATE OF ADMISSION:  11/05/2018  PRIMARY CARE PHYSICIAN: Alan MulderMorayati, Shamil J, MD   REQUESTING/REFERRING PHYSICIAN: Darnelle CatalanMalinda, MD  CHIEF COMPLAINT:   Chief Complaint  Patient presents with  . Altered Mental Status    HISTORY OF PRESENT ILLNESS:  Denise Escobar  is a 85 y.o. female who presents with chief complaint as above.  Patient presents with altered mental status.  She was confused and hallucinating.  Here in the ED she is found to have a UTI pyelonephritis on CT scan.  CT head showed some mild ventriculomegaly and was read by the radiologist has either age-related atrophy versus possible normal pressure hydrocephalus.  Hospitalist called for admission  PAST MEDICAL HISTORY:  Unable to obtain this information due to patient's confusion   PAST SURGICAL HISTORY:   Past Surgical History:  Procedure Laterality Date  . HIP SURGERY  1994     SOCIAL HISTORY:   Social History   Tobacco Use  . Smoking status: Not on file  Substance Use Topics  . Alcohol use: Not on file    Patient is unable to provide this information due to confusion FAMILY HISTORY:  Unable to obtain from patient due to confusion   DRUG ALLERGIES:  No Known Allergies  MEDICATIONS AT HOME:   Prior to Admission medications   Medication Sig Start Date End Date Taking? Authorizing Provider  calcium-vitamin D (OSCAL WITH D) 250-125 MG-UNIT tablet Take 1 tablet by mouth daily.   Yes [provider]  ezetimibe-simvastatin (VYTORIN) 10-40 MG tablet Take 0.5 tablets by mouth daily. 10/25/18  Yes [provider]  montelukast (SINGULAIR) 10 MG tablet Take 10 mg by mouth daily. 02/14/17  Yes [provider]  TOPROL XL 50 MG 24 hr tablet Take 25 mg by mouth daily. 02/14/17  Yes [provider]  Vitamin D, Ergocalciferol, (DRISDOL) 1.25 MG (50000  UT) CAPS capsule Take 50,000 Units by mouth daily. 09/11/18  Yes [provider]  gabapentin (NEURONTIN) 600 MG tablet Take 600 mg by mouth 2 (two) times daily. 02/14/17   [provider]    REVIEW OF SYSTEMS:  Review of Systems  Unable to perform ROS: Acuity of condition     VITAL SIGNS:   Vitals:   11/05/18 1617 11/05/18 1949 11/05/18 2346  BP: (!) 146/85 (!) 154/83 (!) 143/82  Pulse: (!) 110 78 86  Resp: 18 14 17   Temp: 98.5 F (36.9 C)    TempSrc: Oral    SpO2: 95% 98% 96%  Weight: 39.5 kg    Height: 5\' 3"  (1.6 m)     Wt Readings from Last 3 Encounters:  11/05/18 39.5 kg  02/22/17 40.8 kg    PHYSICAL EXAMINATION:  Physical Exam  Vitals reviewed. Constitutional: She appears well-developed and well-nourished. No distress.  HENT:  Head: Normocephalic and atraumatic.  Mouth/Throat: Oropharynx is clear and moist.  Eyes: Pupils are equal, round, and reactive to light. Conjunctivae and EOM are normal. No scleral icterus.  Neck: Normal range of motion. Neck supple. No JVD present. No thyromegaly present.  Cardiovascular: Normal rate, regular rhythm and intact distal pulses. Exam reveals no gallop and no friction rub.  No murmur heard. Respiratory: Effort normal and breath sounds normal. No respiratory distress. She has no wheezes. She has no rales.  GI: Soft. Bowel sounds are normal. She exhibits no  distension. There is no abdominal tenderness.  Musculoskeletal: Normal range of motion.        General: No edema.     Comments: No arthritis, no gout  Lymphadenopathy:    She has no cervical adenopathy.  Neurological: She is alert. No cranial nerve deficit.  Unable to fully assess due to confusion  Skin: Skin is warm and dry. No rash noted. No erythema.  Psychiatric:  Unable to fully assess due to confusion    LABORATORY PANEL:   CBC Recent Labs  Lab 11/05/18 1624  WBC 8.3  HGB 14.4  HCT 45.9  PLT 252    ------------------------------------------------------------------------------------------------------------------  Chemistries  Recent Labs  Lab 11/05/18 1624  NA 142  K 3.5  CL 102  CO2 31  GLUCOSE 105*  BUN 25*  CREATININE 0.61  CALCIUM 9.7  AST 23  ALT 17  ALKPHOS 56  BILITOT 1.4*   ------------------------------------------------------------------------------------------------------------------  Cardiac Enzymes Recent Labs  Lab 11/05/18 1821  TROPONINI <0.03   ------------------------------------------------------------------------------------------------------------------  RADIOLOGY:  Ct Head Wo Contrast  Result Date: 11/05/2018 CLINICAL DATA:  Unexplained alteration of consciousness. EXAM: CT HEAD WITHOUT CONTRAST TECHNIQUE: Contiguous axial images were obtained from the base of the skull through the vertex without intravenous contrast. COMPARISON:  None. FINDINGS: Brain: Moderate to advanced ventriculomegaly, with disproportionate lack of prominence of the cortical sulci. Basilar cisterns are patulous. It is unclear if this pattern represents central atrophy, or normal pressure hydrocephalus. No acute or chronic cortical infarct. No mass lesion, hemorrhage, or extra-axial fluid. Vascular: Calcification of the cavernous internal carotid arteries consistent with cerebrovascular atherosclerotic disease. No signs of intracranial large vessel occlusion. Skull: Calvarium intact. Sinuses/Orbits: Sinuses are clear. Negative orbits. Other: None. IMPRESSION: 1. Moderate to advanced ventriculomegaly. See discussion above. Findings could represent either central atrophy or normal pressure hydrocephalus. 2. No acute intracranial findings. Electronically Signed   By: Elsie Stain M.D.   On: 11/05/2018 19:01   Ct Abdomen Pelvis W Contrast  Result Date: 11/05/2018 CLINICAL DATA:  Unintended weight loss. The ordering physician indicated non-localized abdominal pain. EXAM: CT ABDOMEN AND  PELVIS WITH CONTRAST TECHNIQUE: Multidetector CT imaging of the abdomen and pelvis was performed using the standard protocol following bolus administration of intravenous contrast. CONTRAST:  65mL ISOVUE-300 IOPAMIDOL (ISOVUE-300) INJECTION 61% COMPARISON:  None. FINDINGS: Lower chest: Moderate-sized hiatal hernia. Hyperexpanded lungs. 6.1 x 3.7 mm anterior left lower lobe nodule on image number 3 series 4. 3 mm right middle lobe nodule on image number 2 series 4. 1 mm right middle lobe nodule on image number 1 series 4. 3 mm right middle lobe nodule on image number 2 series 4. 3 mm right middle lobe nodule on image number 8 series 4. 4 mm right lower lobe nodule on image number 8 series 4. 3 mm right upper lobe nodule on image number 1 series 4. 4 mm left lower lobe nodule on image number 8 series 4. Hepatobiliary: Poorly distended gallbladder with moderate diffuse wall thickening and enhancement. No pericholecystic fluid. Unremarkable liver. Pancreas: Unremarkable. No pancreatic ductal dilatation or surrounding inflammatory changes. Spleen: Normal in size without focal abnormality. Adrenals/Urinary Tract: 4 mm lower pole right renal calculus and additional tiny lower pole right renal calculus. Small lower pole right renal cortical cyst. Mild patchy enhancement of the right kidney compared to the left kidney on the initial images, seen on the delayed images. Unremarkable visualized portion of the urinary bladder and ureters. There is streak artifact in the inferior pelvis by a right  hip prosthesis obscuring portions of the bladder and distal ureters. Unremarkable adrenal glands. Stomach/Bowel: Moderate-sized hiatal hernia. Unremarkable small bowel, colon and appendix. Vascular/Lymphatic: Atheromatous arterial calcifications without aneurysm. No enlarged lymph nodes. Reproductive: Uterus and bilateral adnexa are unremarkable. Other: No abdominal wall hernia or abnormality. No abdominopelvic ascites. Musculoskeletal:  Right hip prosthesis. Compression deformities of all of the lumbar and lower thoracic vertebrae. This is most pronounced at the L1 level, where there is a 90% compression deformity with some sclerosis, mild to moderate bony retropulsion and no acute fracture lines. There is moderate spinal stenosis at that level. IMPRESSION: 1. Mild patchy enhancement of the right kidney compared to the left kidney on the initial images. This could be due to mild pyelonephritis. 2. Contracted gallbladder with moderate diffuse wall thickening and enhancement. This could be due to acute or chronic cholecystitis. 3. Small, nonobstructing right renal calculi. 4. Moderate-sized hiatal hernia. 5. Multiple bilateral lung nodules, as described above. No follow-up needed if patient is low-risk (and has no known or suspected primary neoplasm). Non-contrast chest CT can be considered in 12 months if patient is high-risk. This recommendation follows the consensus statement: Guidelines for Management of Incidental Pulmonary Nodules Detected on CT Images: From the Fleischner Society 2017; Radiology 2017; 284:228-243. 6. Changes of COPD with centrilobular emphysema. 7. Compression deformities of all of the lumbar and lower thoracic vertebrae, most pronounced at the L1 level, with bony retropulsion at the level causing moderate spinal stenosis. Emphysema (ICD10-J43.9). Electronically Signed   By: Beckie Salts M.D.   On: 11/05/2018 20:54   Dg Chest Portable 1 View  Result Date: 11/05/2018 CLINICAL DATA:  85 year old female with altered mental status for 3 days. EXAM: PORTABLE CHEST 1 VIEW COMPARISON:  Chest radiographs 02/22/2017. FINDINGS: Portable AP upright view at 1825 hours. Mediastinal contours remain normal. Chronically large lung volumes. Chronic biapical scarring. No pneumothorax, pulmonary edema, pleural effusion or confluent pulmonary opacity. No acute osseous abnormality identified. Negative visible bowel gas pattern. IMPRESSION: 1.   No acute cardiopulmonary abnormality. 2. Chronic pulmonary hyperinflation and apical scarring. Electronically Signed   By: Odessa Fleming M.D.   On: 11/05/2018 18:48    EKG:   Orders placed or performed during the hospital encounter of 11/05/18  . ED EKG  . ED EKG    IMPRESSION AND PLAN:  Principal Problem:   Acute pyelonephritis -IV antibiotics given, urine culture sent.  Suspect this is the cause of her confusion and hallucinations. Active Problems:   Acute encephalopathy -likely due to UTI, treatment as above. Suspect finding on her CT head is likely age-related atrophy.   If her symptoms do not improve with treatment of her infection then we could consider neurology consult.  Chart review performed and case discussed with ED provider. Labs, imaging and/or ECG reviewed by provider and discussed with patient/family. Management plans discussed with the patient and/or family.  DVT PROPHYLAXIS: SubQ lovenox   GI PROPHYLAXIS:  None  ADMISSION STATUS: Inpatient     CODE STATUS: Full  TOTAL TIME TAKING CARE OF THIS PATIENT: 45 minutes.   Barney Drain 11/06/2018, 1:03 AM  [facility]  Office  [phone]  CC: Primary care physician; Alan Mulder, MD  Note:  This document was prepared using Dragon voice recognition software and may include unintentional dictation errors.

## 2018-11-06 NOTE — Evaluation (Signed)
Physical Therapy Evaluation Patient Details Name: Denise Escobar MRN: [id number] DOB: [birth year] Today's Date: 11/06/2018   History of Present Illness  Pt is an 85 year old female admitted for UTI following c/o weakness.  No PMH listed.  Clinical Impression  Pt is an 85 year old female who lives in a one story home with her son and daughter in law, who provide some assistance with ADL's.  Pt is a household ambulator with 4WW at baseline. Pt presented as alert but feeling very "weak".  She was able to perform bed mobility mod I and sit at EOB without assistance.  Pt presented as severely kyphotic which affected sitting, standing and functional balance.  Pt able to stand from bedside with CGA and use of RW.  She ambulated 40 ft in room with RW, with gait deviations indicative of fall risk.  Pt reported fatigue following ambulation.  Pt able to complete seated there ex with min VC's and manual cues.  Pt open to education.  Pt functional reach, standing functional activity and need for RW indicate fall risk with standing activity.  She will continue to benefit from skilled PT with focus on strength, tolerance to activity and balance.    Follow Up Recommendations Home health PT;Supervision - Intermittent    Equipment Recommendations  None recommended by PT    Recommendations for Other Services       Precautions / Restrictions Precautions Precautions: Fall Restrictions Weight Bearing Restrictions: No      Mobility  Bed Mobility Overal bed mobility: Modified Independent             General bed mobility comments: Increased time  Transfers Overall transfer level: Needs assistance Equipment used: Rolling walker (2 wheeled) Transfers: Sit to/from Stand Sit to Stand: Min guard         General transfer comment: Pt able to stand from bedside without physical assistance, slow to stand and reliant on RW.  Ambulation/Gait Ambulation/Gait assistance: Supervision Gait Distance (Feet): 40  Feet Assistive device: Rolling walker (2 wheeled)     Gait velocity interpretation: <1.8 ft/sec, indicate of risk for recurrent falls General Gait Details: Very kyphotic, short step length, low foot clearance and flexed posture.  Pt pushes RW anteriorly but does not experience LOB.  Pt able to navigate obstacles slowly.  Stairs            Wheelchair Mobility    Modified Rankin (Stroke Patients Only)       Balance Overall balance assessment: Needs assistance Sitting-balance support: Feet supported;Bilateral upper extremity supported Sitting balance-Leahy Scale: Good     Standing balance support: Bilateral upper extremity supported Standing balance-Leahy Scale: Fair Standing balance comment: Pt unable to reach overhead to simulate opening cabinet door, can bend to lift object from floor with unilateral support of RW, standing functional reach: 4 inches before pt stepped forward with R LE.                             Pertinent Vitals/Pain Pain Assessment: No/denies pain    Home Living Family/patient expects to be discharged to:: Private residence Living Arrangements: Children(Daughter in law and son (having surgery today)) Available Help at Discharge: Family;Available 24 hours/day Type of Home: House Home Access: Stairs to enter Entrance Stairs-Rails: Can reach both Entrance Stairs-Number of Steps: 4 Home Layout: One level Home Equipment: Walker - 4 wheels      Prior Function Level of Independence: Needs assistance  Gait / Transfers Assistance Needed: Ambulates household distances with rollator  ADL's / Homemaking Assistance Needed: Daughter in law assists with bathing due to severe kyphosis        Hand Dominance        Extremity/Trunk Assessment   Upper Extremity Assessment Upper Extremity Assessment: Generalized weakness(L UE: (3+/5), R UE: 4-/5)    Lower Extremity Assessment Lower Extremity Assessment: Generalized weakness(Grossly  4-/5)    Cervical / Trunk Assessment Cervical / Trunk Assessment: Kyphotic(Severely kypohotic affecting balance and gait)  Communication   Communication: No difficulties  Cognition Arousal/Alertness: Awake/alert Behavior During Therapy: WFL for tasks assessed/performed Overall Cognitive Status: Within Functional Limits for tasks assessed                                        General Comments      Exercises Other Exercises Other Exercises: Seated hip abduction, pillow squeezes, heel slides, quad sets x10 bilaterally with VC's and min manual cues for education.   Assessment/Plan    PT Assessment Patient needs continued PT services  PT Problem List Decreased strength;Decreased mobility;Decreased activity tolerance;Decreased balance       PT Treatment Interventions DME instruction;Functional mobility training;Balance training;Patient/family education;Gait training;Therapeutic activities;Therapeutic exercise;Stair training    PT Goals (Current goals can be found in the Care Plan section)  Acute Rehab PT Goals Patient Stated Goal: To return to walking household distances without getting tired. PT Goal Formulation: With patient Time For Goal Achievement: 11/20/18 Potential to Achieve Goals: Good    Frequency Min 2X/week   Barriers to discharge        Co-evaluation               AM-PAC PT "6 Clicks" Mobility  Outcome Measure Help needed turning from your back to your side while in a flat bed without using bedrails?: None Help needed moving from lying on your back to sitting on the side of a flat bed without using bedrails?: None Help needed moving to and from a bed to a chair (including a wheelchair)?: A Little Help needed standing up from a chair using your arms (e.g., wheelchair or bedside chair)?: A Little Help needed to walk in hospital room?: A Little Help needed climbing 3-5 steps with a railing? : A Little 6 Click Score: 20    End of  Session Equipment Utilized During Treatment: Gait belt Activity Tolerance: Patient limited by fatigue Patient left: in chair;with call bell/phone within reach;with chair alarm set Nurse Communication: Mobility status PT Visit Diagnosis: Unsteadiness on feet (R26.81);Other abnormalities of gait and mobility (R26.89);Muscle weakness (generalized) (M62.81)    Time: 3382-5053 PT Time Calculation (min) (ACUTE ONLY): 25 min   Charges:   PT Evaluation $PT Eval Low Complexity: 1 Low PT Treatments $Therapeutic Exercise: 8-22 mins        Glenetta Hew, PT, DPT   Glenetta Hew 11/06/2018, 1:41 PM

## 2018-11-07 DIAGNOSIS — E43 Unspecified severe protein-calorie malnutrition: Secondary | ICD-10-CM

## 2018-11-07 LAB — CBC
HCT: 36.3 % (ref 36.0–46.0)
Hemoglobin: 11.3 g/dL — ABNORMAL LOW (ref 12.0–15.0)
MCH: 29.6 pg (ref 26.0–34.0)
MCHC: 31.1 g/dL (ref 30.0–36.0)
MCV: 95 fL (ref 80.0–100.0)
Platelets: 219 10*3/uL (ref 150–400)
RBC: 3.82 MIL/uL — ABNORMAL LOW (ref 3.87–5.11)
RDW: 13.2 % (ref 11.5–15.5)
WBC: 4.7 10*3/uL (ref 4.0–10.5)
nRBC: 0 % (ref 0.0–0.2)

## 2018-11-07 LAB — BASIC METABOLIC PANEL
Anion gap: 3 — ABNORMAL LOW (ref 5–15)
BUN: 15 mg/dL (ref 8–23)
CO2: 31 mmol/L (ref 22–32)
Calcium: 8.6 mg/dL — ABNORMAL LOW (ref 8.9–10.3)
Chloride: 106 mmol/L (ref 98–111)
Creatinine, Ser: 0.39 mg/dL — ABNORMAL LOW (ref 0.44–1.00)
GFR calc Af Amer: >60 mL/min (ref >60)
GFR calc non Af Amer: >60 mL/min (ref >60)
GLUCOSE: 89 mg/dL (ref 70–99)
POTASSIUM: 4.6 mmol/L (ref 3.5–5.1)
Sodium: 140 mmol/L (ref 135–145)

## 2018-11-07 LAB — GLUCOSE, CAPILLARY: Glucose-Capillary: 95 mg/dL (ref 70–99)

## 2018-11-07 MED ORDER — SIMVASTATIN 20 MG PO TABS
20.0000 mg | ORAL_TABLET | Freq: Every day | ORAL | Status: DC
  Administered 2018-11-07 – 2018-11-09 (×3): 20 mg via ORAL
  Filled 2018-11-07 (×3): qty 1

## 2018-11-07 MED ORDER — ADULT MULTIVITAMIN W/MINERALS CH
1.0000 | ORAL_TABLET | Freq: Every day | ORAL | Status: DC
  Administered 2018-11-07 – 2018-11-10 (×4): 1 via ORAL
  Filled 2018-11-07 (×4): qty 1

## 2018-11-07 MED ORDER — ENSURE ENLIVE PO LIQD
237.0000 mL | Freq: Three times a day (TID) | ORAL | Status: DC
  Administered 2018-11-07 – 2018-11-10 (×7): 237 mL via ORAL

## 2018-11-07 MED ORDER — HALOPERIDOL LACTATE 5 MG/ML IJ SOLN: 2.0000 mg | Freq: Four times a day (QID) | INTRAMUSCULAR | Status: DC | PRN

## 2018-11-07 MED ORDER — EZETIMIBE 10 MG PO TABS
5.0000 mg | ORAL_TABLET | Freq: Every evening | ORAL | Status: DC
  Administered 2018-11-07 – 2018-11-09 (×3): 5 mg via ORAL
  Filled 2018-11-07 (×4): qty 0.5

## 2018-11-07 MED ORDER — QUETIAPINE FUMARATE 25 MG PO TABS
50.0000 mg | ORAL_TABLET | Freq: Every day | ORAL | Status: DC
  Administered 2018-11-07: 50 mg via ORAL
  Filled 2018-11-07: qty 2

## 2018-11-07 MED ORDER — EZETIMIBE-SIMVASTATIN 10-40 MG PO TABS: 0.5000 | ORAL_TABLET | Freq: Every day | ORAL | Status: DC

## 2018-11-07 MED ORDER — CEPHALEXIN 250 MG PO CAPS
250.0000 mg | ORAL_CAPSULE | Freq: Three times a day (TID) | ORAL | Status: DC
  Administered 2018-11-07 – 2018-11-10 (×10): 250 mg via ORAL
  Filled 2018-11-07 (×12): qty 1

## 2018-11-07 NOTE — Progress Notes (Signed)
Initial Nutrition Assessment  DOCUMENTATION CODES:   Severe malnutrition in context of social or environmental circumstances, Underweight  INTERVENTION:   - Ensure Enlive po TID, each supplement provides 350 kcal and 20 grams of protein (chocolate flavor)  - MVI with minerals daily  NUTRITION DIAGNOSIS:   Severe Malnutrition related to social / environmental circumstances as evidenced by severe fat depletion, severe muscle depletion.  GOAL:   Patient will meet greater than or equal to 90% of their needs  MONITOR:   PO intake, Supplement acceptance, Weight trends, Labs  REASON FOR ASSESSMENT:   Other (underweight BMI)    ASSESSMENT:   85 year old female who presented to the ED on 2/5 with AMS. No significant PMH. Pt admitted with acute pyelonephritis.  Spoke with pt at bedside. Pt altered and perseverating on having someone [place]. Pt also stating things like, "they didn't call for my dinner order because I won't be here then because I'll be dead." Discussed with RN who confirmed that this is consistent with pt's current state.  Noted untouched lunch meal tray at bedside. Pt states that she ate a few bites of carrots. Pt can't remember if she had breakfast.  Pt reports that she typically eats 2 meals daily. Pt states that her son and daughter-in-law cook for her. For breakfast, pt typically eats oatmeal or cheerios with 2% milk. For dinner, pt typically eats meat and cabbage or a burrito.  Pt shares that she drinks at least 1 Ensure or Boost oral nutrition supplement daily. Pt prefers chocolate flavor. RD to order TID.  Pt endorses weight loss over time and states that in high school, she weighed 97 lbs. Pt is unsure of weight trends more recently. Pt does not know her UBW. Weight history in chart is limited as last recorded weight PTA is from 02/22/2017.  Pt states that she takes 2 vitamins daily, Centrum Silver and another  medication that she can't remember. RD to order MVI with minerals daily.  Pt denies any chewing or swallowing issues at this time.  Meal Completion: 75-100%  Medications and labs reviewed.  NUTRITION - FOCUSED PHYSICAL EXAM:    Most Recent Value  Orbital Region  Severe depletion  Upper Arm Region  Severe depletion  Thoracic and Lumbar Region  Severe depletion  Buccal Region  Moderate depletion  Temple Region  Moderate depletion  Clavicle Bone Region  Severe depletion  Clavicle and Acromion Bone Region  Severe depletion  Scapular Bone Region  Severe depletion  Dorsal Hand  Severe depletion  Patellar Region  Severe depletion  Anterior Thigh Region  Severe depletion  Posterior Calf Region  Severe depletion  Edema (RD Assessment)  Mild [BLE]  Hair  Reviewed  Eyes  Reviewed  Mouth  Reviewed  Skin  Reviewed  Nails  Reviewed       Diet Order:   Diet Order            Diet - low sodium heart healthy        Diet Heart Room service appropriate? Yes; Fluid consistency: Thin  Diet effective now              EDUCATION NEEDS:   Not appropriate for education at this time  Skin:  Skin Assessment: Reviewed RN Assessment  Last BM:  2/7 (medium type 6)  Height:   Ht Readings from Last 1 Encounters:  11/05/18 5\' 3"  (1.6 m)    Weight:  Wt Readings from Last 1 Encounters:  11/07/18 36.3 kg    Ideal Body Weight:  52.3 kg  BMI:  Body mass index is 14.18 kg/m.  Estimated Nutritional Needs:   Kcal:  1250-1450  Protein:  55-65 grams  Fluid:  1.3-1.5 L    Earma Reading, MS, RD, LDN Inpatient Clinical Dietitian Pager: [phone] Weekend/After Hours: [phone]

## 2018-11-07 NOTE — Plan of Care (Signed)
Pt remains confused. Keeps stating son was "killed out side of medical center in [place]." Son is well and has spoke to myself as well as the pt today. Reorientation to situation does not seem to be effective, as pt states "call the doctor and talk to him".

## 2018-11-07 NOTE — Progress Notes (Signed)
   [facility] [place] [facility]   PATIENT NAME: Denise Escobar    MR#:  [id number]  DATE OF BIRTH:  08-29-1932  SUBJECTIVE:   Patient was confused this morning and has remained confused throughout the day today.  She is saying things that are not really making sense.  She denies any chest pain, shortness of breath, abdominal pain.  REVIEW OF SYSTEMS:  ROS- unable to obtain due to confusion  DRUG ALLERGIES:  No Known Allergies VITALS:  Blood pressure 139/77, pulse 69, temperature 98.1 F (36.7 C), temperature source Oral, resp. rate 15, height 5\' 3"  (1.6 m), weight 39.5 kg, SpO2 96 %. PHYSICAL EXAMINATION:  Physical Exam  Constitutional: Laying in bed in no acute distress HEENT: Cephalic, atraumatic, EOMI, no scleral icterus, moist mucous membranes Neck: Normal range of motion. Neck supple. No JVD present. No thyromegaly present.  Cardiovascular:  RRR, no murmurs, rubs, or gallops Respiratory: Effort normal and breath sounds normal. No respiratory distress. She has no wheezes. She has no rales.  GI: Soft. Bowel sounds are normal.  Nontender, nondistended. Musculoskeletal:  No pedal edema, cyanosis, clubbing Neurological: Moving all extremities.  Unable to fully assess due to confusion Skin: Skin is warm and dry. No rash noted. No erythema.  Psychiatric:  Unable to assess due to confusion. LABORATORY PANEL:  Female CBC Recent Labs  Lab 11/07/18 0504  WBC 4.7  HGB 11.3*  HCT 36.3  PLT 219   ------------------------------------------------------------------------------------------------------------------ Chemistries  Recent Labs  Lab 11/05/18 1624 11/06/18 0423 11/07/18 0504  NA 142 140 140  K 3.5 3.1* 4.6  CL 102 103 106  CO2 31 32 31  GLUCOSE 105* 94 89  BUN 25* 16 15  CREATININE 0.61 0.42* 0.39*  CALCIUM 9.7 8.1* 8.6*  MG  --  1.7  --   AST 23  --   --   ALT 17  --   --   ALKPHOS 56  --   --   BILITOT 1.4*  --   --    RADIOLOGY:  No  results found. ASSESSMENT AND PLAN:   Acute pyelonephritis- improving.  Patient has been afebrile. -Switch from IV ceftriaxone to Keflex today -Urine culture growing gram-negative rods -Follow-up on final urine culture and susceptibilities tomorrow -PT recommending HHPT  Acute encephalopathy- had resolved yesterday, but patient appears more confused today.  CT head with age-related atrophy.  Suspect she may have a worsening of underlying dementia. -Will check HIV, RPR, TSH, ammonia, vitamin B12, folate to rule out other etiologies  Hypertension-blood pressure stable -Continue home metoprolol  Hyperlipidemia-stable -Continue home ezetimibe-simvastatin   All the records are reviewed and case discussed with Care Management/Social Worker. Management plans discussed with the patient, family and they are in agreement.  CODE STATUS: Full Code  TOTAL TIME TAKING CARE OF THIS PATIENT: 40 minutes.   More than 50% of the time was spent in counseling/coordination of care: YES  POSSIBLE D/C IN 1-2 DAYS, DEPENDING ON CLINICAL CONDITION.   Jinny Blossom Joselin Crandell M.D on 11/07/2018 at 2:21 PM  Between 7am to 6pm - Pager [phone]  After 6pm go to [email or web address] - Social research officer, government  [facility] [place] [facility]  Office  [phone]  CC: Primary care physician; Alan Mulder, MD  Note: This dictation was prepared with Dragon dictation along with smaller phrase technology. Any transcriptional errors that result from this process are unintentional.

## 2018-11-08 DIAGNOSIS — G9340 Encephalopathy, unspecified: Secondary | ICD-10-CM

## 2018-11-08 DIAGNOSIS — R410 Disorientation, unspecified: Secondary | ICD-10-CM

## 2018-11-08 DIAGNOSIS — N10 Acute tubulo-interstitial nephritis: Secondary | ICD-10-CM

## 2018-11-08 DIAGNOSIS — R443 Hallucinations, unspecified: Secondary | ICD-10-CM

## 2018-11-08 DIAGNOSIS — E43 Unspecified severe protein-calorie malnutrition: Secondary | ICD-10-CM

## 2018-11-08 LAB — CBC
HCT: 37.7 % (ref 36.0–46.0)
Hemoglobin: 12 g/dL (ref 12.0–15.0)
MCH: 30.3 pg (ref 26.0–34.0)
MCHC: 31.8 g/dL (ref 30.0–36.0)
MCV: 95.2 fL (ref 80.0–100.0)
Platelets: 247 10*3/uL (ref 150–400)
RBC: 3.96 MIL/uL (ref 3.87–5.11)
RDW: 13.1 % (ref 11.5–15.5)
WBC: 5.5 10*3/uL (ref 4.0–10.5)
nRBC: 0 % (ref 0.0–0.2)

## 2018-11-08 LAB — BASIC METABOLIC PANEL
Anion gap: 6 (ref 5–15)
BUN: 18 mg/dL (ref 8–23)
CALCIUM: 8.9 mg/dL (ref 8.9–10.3)
CO2: 32 mmol/L (ref 22–32)
Chloride: 103 mmol/L (ref 98–111)
Creatinine, Ser: 0.49 mg/dL (ref 0.44–1.00)
GFR calc Af Amer: >60 mL/min (ref >60)
GFR calc non Af Amer: >60 mL/min (ref >60)
Glucose, Bld: 88 mg/dL (ref 70–99)
Potassium: 3.9 mmol/L (ref 3.5–5.1)
Sodium: 141 mmol/L (ref 135–145)

## 2018-11-08 LAB — VITAMIN B12: Vitamin B-12: 578 pg/mL (ref 180–914)

## 2018-11-08 LAB — URINE CULTURE
Culture: >=100000 — AB
Special Requests: NORMAL

## 2018-11-08 LAB — AMMONIA: Ammonia: 17 umol/L (ref 9–35)

## 2018-11-08 LAB — TSH: TSH: 4.552 u[IU]/mL — ABNORMAL HIGH (ref 0.350–4.500)

## 2018-11-08 LAB — FOLATE: Folate: 21.4 ng/mL (ref >5.9)

## 2018-11-08 MED ORDER — HALOPERIDOL LACTATE 2 MG/ML PO CONC
2.0000 mg | Freq: Every day | ORAL | Status: DC
  Administered 2018-11-08 – 2018-11-09 (×2): 2 mg via ORAL
  Filled 2018-11-08 (×3): qty 1

## 2018-11-08 MED ORDER — HALOPERIDOL LACTATE 5 MG/ML IJ SOLN: 2.0000 mg | Freq: Three times a day (TID) | INTRAMUSCULAR | Status: DC | PRN

## 2018-11-08 MED ORDER — HALOPERIDOL LACTATE 2 MG/ML PO CONC
1.0000 mg | Freq: Three times a day (TID) | ORAL | Status: DC | PRN
  Filled 2018-11-08: qty 0.5

## 2018-11-08 MED ORDER — MIRTAZAPINE 15 MG PO TBDP
7.5000 mg | ORAL_TABLET | Freq: Every day | ORAL | Status: DC
  Administered 2018-11-08 – 2018-11-09 (×2): 7.5 mg via ORAL
  Filled 2018-11-08 (×3): qty 0.5

## 2018-11-08 NOTE — Progress Notes (Signed)
[facility] - [place] at [facility]   PATIENT NAME: Denise Escobar    MR#:  [id number]  DATE OF BIRTH:  [birth year]  SUBJECTIVE:   Patient is awake and alert , could tell her name birthday and age  Does not feel like eating asking for her son  She denies any chest pain, shortness of breath, abdominal pain. According to the RNs report last night patient was thinking that her son Denise Escobar was dead   REVIEW OF SYSTEMS:  Review of Systems  Constitutional: Negative for diaphoresis and fever.  HENT: Positive for nosebleeds. Negative for congestion and sore throat.   Respiratory: Negative for cough, sputum production and shortness of breath.   Cardiovascular: Negative for chest pain, orthopnea and PND.  Gastrointestinal: Negative for abdominal pain, diarrhea and vomiting.  Genitourinary: Negative for frequency.  Musculoskeletal: Negative.   Skin: Negative for itching and rash.  Neurological: Negative for dizziness, seizures and headaches.  Psychiatric/Behavioral: Positive for hallucinations.  - unable to obtain due to confusion  DRUG ALLERGIES:  No Known Allergies VITALS:  Blood pressure 102/60, pulse 94, temperature 98.5 F (36.9 C), temperature source Oral, resp. rate 18, height 5\' 3"  (1.6 m), weight 36.3 kg, SpO2 91 %. PHYSICAL EXAMINATION:  Physical Exam  Constitutional: Laying in bed in no acute distress HEENT: Cephalic, atraumatic, EOMI, no scleral icterus, moist mucous membranes Neck: Normal range of motion. Neck supple. No JVD present. No thyromegaly present.  Cardiovascular:  RRR, no murmurs, rubs, or gallops Respiratory: Effort normal and breath sounds normal. No respiratory distress. She has no wheezes. She has no rales.  GI: Soft. Bowel sounds are normal.  Nontender, nondistended. Musculoskeletal:  No pedal edema, cyanosis, clubbing Neurological: Moving all extremities.  Unable to fully assess due to confusion Skin: Skin is warm and dry. No rash noted. No  erythema.  Psychiatric:  Flat mood and affect LABORATORY PANEL:  Female CBC Recent Labs  Lab 11/08/18 0517  WBC 5.5  HGB 12.0  HCT 37.7  PLT 247   ------------------------------------------------------------------------------------------------------------------ Chemistries  Recent Labs  Lab 11/05/18 1624 11/06/18 0423  11/08/18 0517  NA 142 140   < > 141  K 3.5 3.1*   < > 3.9  CL 102 103   < > 103  CO2 31 32   < > 32  GLUCOSE 105* 94   < > 88  BUN 25* 16   < > 18  CREATININE 0.61 0.42*   < > 0.49  CALCIUM 9.7 8.1*   < > 8.9  MG  --  1.7  --   --   AST 23  --   --   --   ALT 17  --   --   --   ALKPHOS 56  --   --   --   BILITOT 1.4*  --   --   --    < > = values in this interval not displayed.   RADIOLOGY:  No results found. ASSESSMENT AND PLAN:   Acute pyelonephritis- improving.  Patient has been afebrile. -Switch from IV ceftriaxone to Keflex 11/07/2018 -Urine culture growing gram-negative rods-E. coli pansensitive -PT recommending HHPT  Acute encephalopathy- had resolved yesterday, but patient appears more confused may be possible depression  CT head with age-related atrophy.  Suspect she may have a worsening of underlying dementia. -Will check HIV, RPR, TSH, ammonia, vitamin B12, folate to rule out other etiologies -Psychiatry consult placed-Dr. Viviano Simas is aware of the consult  Hypertension-blood pressure  stable -Continue home metoprolol  Hyperlipidemia-stable -Continue home ezetimibe-simvastatin   All the records are reviewed and case discussed with Care Management/Social Worker. Management plans discussed with the patient, son Denise Loaim and they are in agreement.  CODE STATUS: Full Code  TOTAL TIME TAKING CARE OF THIS PATIENT:35 minutes.   More than 50% of the time was spent in counseling/coordination of care: YES  POSSIBLE D/C IN 1-2 DAYS, DEPENDING ON CLINICAL CONDITION.   Ramonita LabAruna Etrulia Escobar M.D on 11/08/2018 at 4:31 PM  Between 7am to 6pm - Pager -  [phone] After 6pm go to [email or web address] - Social research officer, governmentpassword [employer]AS [facility]  [facility] [place] [facility]  Office  [phone]  CC: Primary care physician; Denise Escobar, Denise J, MD  Note: This dictation was prepared with Dragon dictation along with smaller phrase technology. Any transcriptional errors that result from this process are unintentional.

## 2018-11-08 NOTE — Consult Note (Signed)
[facility] Face-to-Face Psychiatry Consult   Reason for Consult:  Change in mental status Referring Physician:  Dr. Amado Coe Patient Identification: Denise Escobar MRN:  [id number] Principal Diagnosis: Acute pyelonephritis Diagnosis:  Principal Problem:   Acute pyelonephritis Active Problems:   Acute encephalopathy   Protein-calorie malnutrition, severe   Delirium   Hallucinations   Total Time spent with patient: 1 hour  Subjective:  "I don't want my son to die"  HPI: Denise Escobar is a 85 y.o. female patient admitted with altered mental status.  She was confused and hallucinating.  In the ED she is found to have a UTI pyelonephritis on CT scan.  CT head showed some mild ventriculomegaly and was read by the radiologist has either age-related atrophy versus possible normal pressure hydrocephalus.   Psychiatry consult is requested to evaluate for depression, and for medication recommendations.  Patient is seen, and chart is reviewed.  Patient on initial evaluation is barely arousable and unable to give history.  Reevaluation is completed with son, [name] ([phone]) at bedside.  Nurses report that patient has been stating that she feels worthless, feels like she is dying, and has been fearing her son is dead.  They have been needing to bring her the phone in order for her to call him for reassurance.  Past Psychiatric History: Patient has "had a rough life with a history of depression, however never had medical treatment."  Patient's son describes that [name] has been living with him and his wife of 4 years (from [place]  He reports that his mother has not always approved of his marriage, however in the past few years has grown to dislike his wife, and in the past week patient has become paranoid that his wife is sending [place] hitmen to kill her and her son in order to get their money in their property.  Son does report that there is a gun in the house, and that his mother  has been stating that she should get it to protect herself from the [place].  Patient however reports that she is afraid of guns, and would never use it to harm herself or to harm others.  Ms. Korst believes that she has unintentionally murdered her son 4 times, in the past week, and requires reassurance during the exam that her son is alive and not a ghost.  She is reassured when she is able to help him.  Patient does appear anxious and tearful at points during the exam.  She and her son are agreeable for starting medication for both depression and to relieve psychosis.  Patient has been eating minimally, however with daughter-in-law at bedside she is taking protein enriched drink.  She denies suicidal and homicidal ideation.  Per son, patient has been having both auditory and visual hallucinations.  Son reports that this is a sudden change in patient's baseline.  He reports that she has not driven in 4 years, but has never had a diagnosis of dementia.  He states that at her last physical, PCP- Dr. Johny Chess did a cognitive assessment on her in January 2020, and patient past except for missing the year.  On evaluation, patient states the year is 2019, but corrects herself.  She states the month as March, but is able to say February with prompting.  She knows her birthdate, and is able to state the past 7 presidents (past 3 without prompting and other for with hints).  Risk to Self:  None Risk to Others:  None Prior Inpatient Therapy:  None Prior Outpatient Therapy:  None  Past Medical History: History reviewed. No pertinent past medical history.  Past Surgical History:  Procedure Laterality Date  . HIP SURGERY  1994   Family History: No family history on file. Family Psychiatric  History: Denies  Social History:  Social History   Substance and Sexual Activity  Alcohol Use Not on file     Social History   Substance and Sexual Activity  Drug Use Not on file    Social History    Socioeconomic History  . Marital status: Widowed    Spouse name: Not on file  . Number of children: Not on file  . Years of education: Not on file  . Highest education level: Not on file  Occupational History  . Not on file  Social Needs  . Financial resource strain: Not on file  . Food insecurity:    Worry: Not on file    Inability: Not on file  . Transportation needs:    Medical: Not on file    Non-medical: Not on file  Tobacco Use  . Smoking status: Not on file  Substance and Sexual Activity  . Alcohol use: Not on file  . Drug use: Not on file  . Sexual activity: Not on file  Lifestyle  . Physical activity:    Days per week: Not on file    Minutes per session: Not on file  . Stress: Not on file  Relationships  . Social connections:    Talks on phone: Not on file    Gets together: Not on file    Attends religious service: Not on file    Active member of club or organization: Not on file    Attends meetings of clubs or organizations: Not on file    Relationship status: Not on file  Other Topics Concern  . Not on file  Social History Narrative  . Not on file   Additional Social History:  Lives with son and daughter-in-law  Allergies:  No Known Allergies  Labs:  Results for orders placed or performed during the hospital encounter of 11/05/18 (from the past 48 hour(s))  CBC     Status: Abnormal   Collection Time: 11/07/18  5:04 AM  Result Value Ref Range   WBC 4.7 4.0 - 10.5 K/uL   RBC 3.82 (L) 3.87 - 5.11 MIL/uL   Hemoglobin 11.3 (L) 12.0 - 15.0 g/dL   HCT 19.136.3 47.836.0 - 29.546.0 %   MCV 95.0 80.0 - 100.0 fL   MCH 29.6 26.0 - 34.0 pg   MCHC 31.1 30.0 - 36.0 g/dL   RDW 62.113.2 30.811.5 - 65.715.5 %   Platelets 219 150 - 400 K/uL   nRBC 0.0 0.0 - 0.2 %    Comment: Performed at [facility], [address] [place], [place] [id number]  Basic metabolic panel     Status: Abnormal   Collection Time: 11/07/18  5:04 AM  Result Value Ref Range   Sodium 140 135 - 145  mmol/L   Potassium 4.6 3.5 - 5.1 mmol/L   Chloride 106 98 - 111 mmol/L   CO2 31 22 - 32 mmol/L   Glucose, Bld 89 70 - 99 mg/dL   BUN 15 8 - 23 mg/dL   Creatinine, Ser 2.950.39 (L) 0.44 - 1.00 mg/dL   Calcium 8.6 (L) 8.9 - 10.3 mg/dL   GFR calc non Af Amer >60 >60 mL/min   GFR calc Af Amer >  60 >60 mL/min   Anion gap 3 (L) 5 - 15    Comment: Performed at [facility], [address] [place], [place] [address]  Glucose, capillary     Status: None   Collection Time: 11/07/18  7:44 AM  Result Value Ref Range   Glucose-Capillary 95 70 - 99 mg/dL  TSH     Status: Abnormal   Collection Time: 11/08/18  5:17 AM  Result Value Ref Range   TSH 4.552 (H) 0.350 - 4.500 uIU/mL    Comment: Performed by a 3rd Generation assay with a functional sensitivity of <=0.01 uIU/mL. Performed at [facility], [address] [place], [place] [address]   Ammonia     Status: None   Collection Time: 11/08/18  5:17 AM  Result Value Ref Range   Ammonia 17 9 - 35 umol/L    Comment: Performed at [facility], [address] [place], [place] [address]  Folate     Status: None   Collection Time: 11/08/18  5:17 AM  Result Value Ref Range   Folate 21.4 >5.9 ng/mL    Comment: Performed at [facility], [address] [place], [place] [address]  Vitamin B12     Status: None   Collection Time: 11/08/18  5:17 AM  Result Value Ref Range   Vitamin B-12 578 180 - 914 pg/mL    Comment: (NOTE) This assay is not validated for testing neonatal or myeloproliferative syndrome specimens for Vitamin B12 levels. Performed at [facility], [address], [place], [place] [address]   CBC     Status: None   Collection Time: 11/08/18  5:17 AM  Result Value Ref Range   WBC 5.5 4.0 - 10.5 K/uL   RBC 3.96 3.87 - 5.11 MIL/uL   Hemoglobin 12.0 12.0 - 15.0 g/dL   HCT 13.0 86.5 - 78.4 %   MCV 95.2 80.0 - 100.0 fL   MCH 30.3 26.0 - 34.0 pg   MCHC 31.8 30.0 - 36.0 g/dL   RDW 69.6 29.5  - 28.4 %   Platelets 247 150 - 400 K/uL   nRBC 0.0 0.0 - 0.2 %    Comment: Performed at [facility], [address] [place], [place] [address]  Basic metabolic panel     Status: None   Collection Time: 11/08/18  5:17 AM  Result Value Ref Range   Sodium 141 135 - 145 mmol/L   Potassium 3.9 3.5 - 5.1 mmol/L   Chloride 103 98 - 111 mmol/L   CO2 32 22 - 32 mmol/L   Glucose, Bld 88 70 - 99 mg/dL   BUN 18 8 - 23 mg/dL   Creatinine, Ser 0.10 0.44 - 1.00 mg/dL   Calcium 8.9 8.9 - 27.2 mg/dL   GFR calc non Af Amer >60 >60 mL/min   GFR calc Af Amer >60 >60 mL/min   Anion gap 6 5 - 15    Comment: Performed at [facility], [address] [place], [place] [address]    Current Facility-Administered Medications  Medication Dose Route Frequency Provider Last Rate Last Dose  . acetaminophen (TYLENOL) tablet 650 mg  650 mg Oral Q6H PRN Oralia Manis, MD   650 mg at 11/06/18 2038   Or  . acetaminophen (TYLENOL) suppository 650 mg  650 mg Rectal Q6H PRN Oralia Manis, MD      . cephALEXin (KEFLEX) capsule 250 mg  250 mg Oral TID Mayo, Allyn Kenner, MD  250 mg at 11/08/18 1025  . enoxaparin (LOVENOX) injection 30 mg  30 mg Subcutaneous Q24H Oralia Manis, MD   30 mg at 11/07/18 2147  . ezetimibe (ZETIA) tablet 5 mg  5 mg Oral QPM Hallaji, Sheema M, RPH   5 mg at 11/07/18 3220   And  . simvastatin (ZOCOR) tablet 20 mg  20 mg Oral q1800 Gardner Candle, RPH   20 mg at 11/07/18 2542  . feeding supplement (ENSURE ENLIVE) (ENSURE ENLIVE) liquid 237 mL  237 mL Oral TID BM Mayo, Allyn Kenner, MD   237 mL at 11/08/18 1026  . haloperidol lactate (HALDOL) injection 2 mg  2 mg Intravenous Q6H PRN Mayo, Allyn Kenner, MD      . metoprolol succinate (TOPROL-XL) 24 hr tablet 25 mg  25 mg Oral Daily Oralia Manis, MD   25 mg at 11/08/18 1025  . montelukast (SINGULAIR) tablet 10 mg  10 mg Oral Daily Oralia Manis, MD   10 mg at 11/08/18 1025  . multivitamin with minerals tablet 1 tablet  1 tablet Oral  Daily Mayo, Allyn Kenner, MD   1 tablet at 11/08/18 1025  . ondansetron (ZOFRAN) tablet 4 mg  4 mg Oral Q6H PRN Oralia Manis, MD       Or  . ondansetron [facility]) injection 4 mg  4 mg Intravenous Q6H PRN Oralia Manis, MD      . QUEtiapine (SEROQUEL) tablet 50 mg  50 mg Oral QHS Mayo, Allyn Kenner, MD   50 mg at 11/07/18 2147  . sodium chloride flush (NS) 0.9 % injection 3 mL  3 mL Intravenous Once Oralia Manis, MD        Musculoskeletal: Strength & Muscle Tone: decreased Gait & Station: Currently requires assistance Patient leans: Right  Psychiatric Specialty Exam: Physical Exam  Nursing note and vitals reviewed. Constitutional: She appears well-developed and well-nourished. No distress.  HENT:  Head: Normocephalic and atraumatic.  Eyes: EOM are normal.  Neck: Normal range of motion.  Cardiovascular: Normal rate.  Respiratory: Effort normal.  Musculoskeletal: Normal range of motion.  Neurological: She is alert.  Skin: Skin is warm and dry.    Review of Systems  Constitutional: Positive for malaise/fatigue and weight loss.       Decreased appetite  HENT: Positive for hearing loss.   Eyes:       Decreased vision  Respiratory: Negative.   Cardiovascular: Negative.   Gastrointestinal: Negative.   Genitourinary:       Urinary incontinance  Musculoskeletal: Negative.   Skin: Negative.   Neurological: Positive for weakness.       Confused  Psychiatric/Behavioral: Positive for depression, hallucinations and memory loss (sudden). Negative for substance abuse and suicidal ideas. The patient is nervous/anxious. The patient does not have insomnia.     Blood pressure 114/78, pulse 88, temperature 97.6 F (36.4 C), temperature source Oral, resp. rate 14, height 5\' 3"  (1.6 m), weight 36.3 kg, SpO2 94 %.Body mass index is 14.18 kg/m.  General Appearance: thin and frail  Eye Contact:  Minimal  Speech:  Garbled  Volume:  Decreased  Mood:  Anxious and Dysphoric  Affect:  Blunt  Thought  Process:  Descriptions of Associations: Loose  Orientation:  Other:  oriented with prompts  Thought Content:  Delusions, Hallucinations: Auditory Visual, Paranoid Ideation and Rumination  Suicidal Thoughts:  No  Homicidal Thoughts:  No  Memory:  fluctuating  Judgement:  Fair  Insight:  Lacking  Psychomotor Activity:  Normal  Concentration:  Concentration: Fair  Recall:  Poor  Fund of Knowledge:  Fair  Language:  Fair  Akathisia:  No  Handed:  Right  AIMS (if indicated):   0  Assets:  Financial Resources/Insurance Housing Social Support  ADL's:  Impaired  Cognition:  Impaired,  Moderate  Sleep:   Fluctuating     Treatment Plan Summary:  Denise Escobar is a 85 y.o. female with no known past psychiatric history, who presented to the emergency room with acute change in altered mental status in the setting of pyelonephritis.  Patient symptoms clinically correlate with episode of delirium.  Patient likely has an underlying depression.  Medication recommendations below.  Patient and family made aware that patient is at higher risk for future delirium events once first episode of delirium has occurred.   Daily contact with patient to assess and evaluate symptoms and progress in treatment, Medication management and Start mirtazapine 7.5 mg daily at bedtime for depression and to maximize side effect profile of sedation and appetite stimulation at lower dose; discontinue Seroquel and changed to Haldol 2 mg/mL solution giving 2 mg at bedtime; Haldol 2 mg/mL solution 1 mg every 8 hours as needed for increased agitation or hallucinations.   Disposition: No evidence of imminent risk to self or others at present.   Patient does not meet criteria for psychiatric inpatient admission. Supportive therapy provided about ongoing stressors. Educated family on delirium and depression, reviewed medications with explanation of risks, benefits, side effects, and adverse effects to include black box  warnings  Mariel CraftSHEILA M Timiya Howells, MD 11/08/2018 10:42 AM

## 2018-11-09 DIAGNOSIS — R443 Hallucinations, unspecified: Secondary | ICD-10-CM

## 2018-11-09 DIAGNOSIS — R410 Disorientation, unspecified: Secondary | ICD-10-CM

## 2018-11-09 DIAGNOSIS — G9340 Encephalopathy, unspecified: Secondary | ICD-10-CM

## 2018-11-09 DIAGNOSIS — E43 Unspecified severe protein-calorie malnutrition: Secondary | ICD-10-CM

## 2018-11-09 DIAGNOSIS — N10 Acute tubulo-interstitial nephritis: Secondary | ICD-10-CM

## 2018-11-09 MED ORDER — HALOPERIDOL LACTATE 2 MG/ML PO CONC
1.0000 mg | Freq: Two times a day (BID) | ORAL | Status: DC
  Administered 2018-11-09 – 2018-11-10 (×2): 1 mg via ORAL
  Filled 2018-11-09 (×3): qty 0.5

## 2018-11-09 NOTE — Consult Note (Signed)
[facility] Face-to-Face Psychiatry Consult   Reason for Consult:  Change in mental status Referring Physician:  Dr. Amado Coe Patient Identification: Denise Escobar MRN:  [id number] Principal Diagnosis: Acute pyelonephritis Diagnosis:  Principal Problem:   Acute pyelonephritis Active Problems:   Acute encephalopathy   Protein-calorie malnutrition, severe   Delirium   Hallucinations   Total Time spent with patient: 45 minutes  Subjective:  "I want to know that I will be able to feel better."  HPI: Denise Escobar is a 85 y.o. female patient admitted with altered mental status.  She was confused and hallucinating.  In the ED she is found to have a UTI pyelonephritis on CT scan.  CT head showed some mild ventriculomegaly and was read by the radiologist has either age-related atrophy versus possible normal pressure hydrocephalus.   Psychiatry consult is requested to evaluate for depression, and for medication recommendations.  Patient is seen, and chart is reviewed.  Patient on initial evaluation is barely arousable and unable to give history.  Reevaluation is completed with son, Denise Escobar ([phone]) at bedside.  Nurses report that patient slept better last night with addition of Haldol.  She has continued to have hallucinations, fears that she is dying, and has been fearing her son is dead.    Per intake: Past Psychiatric History: Patient has "had a rough life with a history of depression, however never had medical treatment."  Patient's son describes that Denise Escobar has been living with him and his wife of 4 years (from [place]  He reports that his mother has not always approved of his marriage, however in the past few years has grown to dislike his wife, and in the past week patient has become paranoid that his wife is sending [place] hitmen to kill her and her son in order to get their money in their property.  Son does report that there is a gun in the house, and that his mother  has been stating that she should get it to protect herself from the [place].  Patient however reports that she is afraid of guns, and would never use it to harm herself or to harm others.  Ms. Ask believes that she has unintentionally murdered her son 4 times, in the past week, and requires reassurance during the exam that her son is alive and not a ghost.  She is reassured when she is able to help him.  Patient does appear anxious and tearful at points during the exam.  She and her son are agreeable for starting medication for both depression and to relieve psychosis.  Patient has been eating minimally, however with daughter-in-law at bedside she is taking protein enriched drink.  She denies suicidal and homicidal ideation.  Per son, patient has been having both auditory and visual hallucinations.  Son reports that this is a sudden change in patient's baseline.  He reports that she has not driven in 4 years, but has never had a diagnosis of dementia.  He states that at her last physical, PCP- Denise Escobar did a cognitive assessment on her in January 2020, and patient past except for missing the year.  On evaluation, patient states the year is 2019, but corrects herself.  She states the month as March, but is able to say February with prompting.  She knows her birthdate, and is able to state the past 7 presidents (past 3 without prompting and other for with hints).  On evaluation today, patient is awake and alert, with son  at bedside.  Son expresses concern for patient's ongoing delusions.  He does feel that hallucinations have improved some.  Patient seems calmer and is more conversant with logical conversation today.  She continues to deny suicidal and homicidal ideation, but continues to endorse fears that she has killed her son.  Risk to Self:  None Risk to Others:  None Prior Inpatient Therapy:  None Prior Outpatient Therapy:  None  Past Medical History: History reviewed. No pertinent past  medical history.  Past Surgical History:  Procedure Laterality Date  . HIP SURGERY  1994   Family History: No family history on file. Family Psychiatric  History: Denies  Social History:  Social History   Substance and Sexual Activity  Alcohol Use Not on file     Social History   Substance and Sexual Activity  Drug Use Not on file    Social History   Socioeconomic History  . Marital status: Widowed    Spouse name: Not on file  . Number of children: Not on file  . Years of education: Not on file  . Highest education level: Not on file  Occupational History  . Not on file  Social Needs  . Financial resource strain: Not on file  . Food insecurity:    Worry: Not on file    Inability: Not on file  . Transportation needs:    Medical: Not on file    Non-medical: Not on file  Tobacco Use  . Smoking status: Not on file  Substance and Sexual Activity  . Alcohol use: Not on file  . Drug use: Not on file  . Sexual activity: Not on file  Lifestyle  . Physical activity:    Days per week: Not on file    Minutes per session: Not on file  . Stress: Not on file  Relationships  . Social connections:    Talks on phone: Not on file    Gets together: Not on file    Attends religious service: Not on file    Active member of club or organization: Not on file    Attends meetings of clubs or organizations: Not on file    Relationship status: Not on file  Other Topics Concern  . Not on file  Social History Narrative  . Not on file   Additional Social History:  Lives with son and daughter-in-law  Allergies:  No Known Allergies  Labs:  Results for orders placed or performed during the hospital encounter of 11/05/18 (from the past 48 hour(s))  TSH     Status: Abnormal   Collection Time: 11/08/18  5:17 AM  Result Value Ref Range   TSH 4.552 (H) 0.350 - 4.500 uIU/mL    Comment: Performed by a 3rd Generation assay with a functional sensitivity of <=0.01 uIU/mL. Performed at  [facility], [address] [place], [place] [address]   Ammonia     Status: None   Collection Time: 11/08/18  5:17 AM  Result Value Ref Range   Ammonia 17 9 - 35 umol/L    Comment: Performed at [facility], [address] [place], [place] [address]  Folate     Status: None   Collection Time: 11/08/18  5:17 AM  Result Value Ref Range   Folate 21.4 >5.9 ng/mL    Comment: Performed at [facility], [address] [place], [place] [address]  Vitamin B12     Status: None   Collection Time: 11/08/18  5:17 AM  Result Value Ref Range   Vitamin B-12 578 180 - 914 pg/mL    Comment: (NOTE) This assay is not validated for testing neonatal or myeloproliferative syndrome specimens for Vitamin B12 levels. Performed at [facility], [address], [place], [place] [address]   CBC     Status: None   Collection Time: 11/08/18  5:17 AM  Result Value Ref Range   WBC 5.5 4.0 - 10.5 K/uL   RBC 3.96 3.87 - 5.11 MIL/uL   Hemoglobin 12.0 12.0 - 15.0 g/dL   HCT 60.4 54.0 - 98.1 %   MCV 95.2 80.0 - 100.0 fL   MCH 30.3 26.0 - 34.0 pg   MCHC 31.8 30.0 - 36.0 g/dL   RDW 19.1 47.8 - 29.5 %   Platelets 247 150 - 400 K/uL   nRBC 0.0 0.0 - 0.2 %    Comment: Performed at [facility], [address] [place], [place] [address]  Basic metabolic panel     Status: None   Collection Time: 11/08/18  5:17 AM  Result Value Ref Range   Sodium 141 135 - 145 mmol/L   Potassium 3.9 3.5 - 5.1 mmol/L   Chloride 103 98 - 111 mmol/L   CO2 32 22 - 32 mmol/L   Glucose, Bld 88 70 - 99 mg/dL   BUN 18 8 - 23 mg/dL   Creatinine, Ser 8.65 0.44 - 1.00 mg/dL   Calcium 8.9 8.9 - 78.4 mg/dL   GFR calc non Af Amer >60 >60 mL/min   GFR calc Af Amer >60 >60 mL/min   Anion gap 6 5 - 15    Comment: Performed at [facility], [address] [place], [place] [address]    Current Facility-Administered Medications  Medication Dose Route Frequency Provider Last Rate  Last Dose  . acetaminophen (TYLENOL) tablet 650 mg  650 mg Oral Q6H PRN Oralia Manis, MD   650 mg at 11/06/18 2038   Or  . acetaminophen (TYLENOL) suppository 650 mg  650 mg Rectal Q6H PRN Oralia Manis, MD      . cephALEXin [facility]) capsule 250 mg  250 mg Oral TID Campbell Stall, MD   250 mg at 11/09/18 0946  . enoxaparin (LOVENOX) injection 30 mg  30 mg Subcutaneous Q24H Oralia Manis, MD   30 mg at 11/08/18 2253  . ezetimibe (ZETIA) tablet 5 mg  5 mg Oral QPM Hallaji, Sheema M, RPH   5 mg at 11/08/18 1641   And  . simvastatin (ZOCOR) tablet 20 mg  20 mg Oral q1800 Gardner Candle, RPH   20 mg at 11/08/18 1641  . feeding supplement (ENSURE ENLIVE) (ENSURE ENLIVE) liquid 237 mL  237 mL Oral TID BM Mayo, Allyn Kenner, MD   237 mL at 11/09/18 0946  . haloperidol (HALDOL) 2 MG/ML solution 1 mg  1 mg Oral Q8H PRN Mariel Craft, MD      . haloperidol (HALDOL) 2 MG/ML solution 2 mg  2 mg Oral QHS Mariel Craft, MD   2 mg at 11/08/18 2254  . haloperidol lactate (HALDOL) injection 2 mg  2 mg Intramuscular Q8H PRN Mariel Craft, MD      . metoprolol succinate (TOPROL-XL) 24 hr tablet 25 mg  25 mg Oral Daily Oralia Manis, MD   25 mg at 11/09/18 0946  . mirtazapine (REMERON SOL-TAB) disintegrating tablet 7.5 mg  7.5 mg Oral QHS Mariel Craft, MD   7.5 mg at  11/08/18 2253  . montelukast (SINGULAIR) tablet 10 mg  10 mg Oral Daily Oralia Manis, MD   10 mg at 11/09/18 0946  . multivitamin with minerals tablet 1 tablet  1 tablet Oral Daily Mayo, Allyn Kenner, MD   1 tablet at 11/09/18 0946  . ondansetron (ZOFRAN) tablet 4 mg  4 mg Oral Q6H PRN Oralia Manis, MD       Or  . ondansetron [facility]) injection 4 mg  4 mg Intravenous Q6H PRN Oralia Manis, MD      . sodium chloride flush (NS) 0.9 % injection 3 mL  3 mL Intravenous Once Oralia Manis, MD        Musculoskeletal: Strength & Muscle Tone: decreased Gait & Station: Currently requires assistance Patient leans: Right  Psychiatric Specialty  Exam: Physical Exam  Nursing note and vitals reviewed. Constitutional: She appears well-developed and well-nourished. No distress.  HENT:  Head: Normocephalic and atraumatic.  Eyes: EOM are normal.  Neck: Normal range of motion.  Cardiovascular: Normal rate.  Respiratory: Effort normal.  Musculoskeletal: Normal range of motion.  Neurological: She is alert.  Skin: Skin is warm and dry.    Review of Systems  Constitutional: Positive for malaise/fatigue and weight loss.       Decreased appetite  HENT: Positive for hearing loss.   Eyes:       Decreased vision  Respiratory: Negative.   Cardiovascular: Negative.   Gastrointestinal: Negative.   Genitourinary:       Urinary incontinance  Musculoskeletal: Negative.   Skin: Negative.   Neurological: Positive for weakness.       Confused  Psychiatric/Behavioral: Positive for depression, hallucinations and memory loss (sudden). Negative for substance abuse and suicidal ideas. The patient is nervous/anxious. The patient does not have insomnia.     Blood pressure 132/84, pulse 93, temperature 97.8 F (36.6 C), temperature source Oral, resp. rate 16, height 5\' 3"  (1.6 m), weight 36.3 kg, SpO2 94 %.Body mass index is 14.18 kg/m.  General Appearance: thin and frail  Eye Contact:  Fair  Speech:  Clear and Coherent  Volume:  Decreased  Mood:  Anxious and Dysphoric  Affect:  Blunt  Thought Process:  Descriptions of Associations: Tangential  Orientation:  Other:  oriented with prompts  Thought Content:  Delusions, Hallucinations: Auditory Visual, Paranoid Ideation and Rumination  Suicidal Thoughts:  No  Homicidal Thoughts:  No  Memory:  fluctuating  Judgement:  Fair  Insight:  Lacking  Psychomotor Activity:  Normal  Concentration:  Concentration: Fair  Recall:  Poor  Fund of Knowledge:  Fair  Language:  Fair  Akathisia:  No  Handed:  Right  AIMS (if indicated):   0  Assets:  Financial Resources/Insurance Housing Social Support   ADL's:  Impaired  Cognition:  Impaired,  Moderate  Sleep:   Fluctuating     Treatment Plan Summary:  SYNQUIS GILARDI is a 85 y.o. female with no known past psychiatric history, who presented to the emergency room with acute change in altered mental status in the setting of pyelonephritis.  Patient symptoms clinically correlate with episode of delirium.  Patient likely has an underlying depression.  Medication recommendations below.  Patient and family made aware that patient is at higher risk for future delirium events once first episode of delirium has occurred.   Daily contact with patient to assess and evaluate symptoms and progress in treatment, Medication management and Start mirtazapine 7.5 mg daily at bedtime for depression and to maximize side effect  profile of sedation and appetite stimulation at lower dose; discontinue Seroquel and changed to Haldol 2 mg/mL solution giving 2 mg at bedtime;  Orders changed to schedule Haldol 2 mg/mL solution 1 mg morning and afternoon for agitation, delusions and hallucinations.  Monitor patient for excessive sedation, or dystonia.  We will avoid Cogentin and Benadryl at this time for dystonia prevention, as this can have cognitive blunting which would worsen her delirium.  Disposition: No evidence of imminent risk to self or others at present.   Patient does not meet criteria for psychiatric inpatient admission. Supportive therapy provided about ongoing stressors. Educated family on delirium and depression, reviewed medications with explanation of risks, benefits, side effects, and adverse effects to include black box warnings   Thank you for allowing me to assist in the care of this lovely lady and her family.   Mariel CraftSHEILA M , MD 11/09/2018 12:21 PM

## 2018-11-09 NOTE — Progress Notes (Signed)
[facility] - [place] [facility]   PATIENT NAME: Darleene CleaverMamie Brendlinger    MR#:  [id number]  DATE OF BIRTH:  05/09/1932  SUBJECTIVE:   Patient is awake and alert , still hallucinating , asks " am I  Dead" ?  Is my son still alive?   could tell her name birthday and age    REVIEW OF SYSTEMS:  Review of Systems  Constitutional: Negative for diaphoresis and fever.  HENT: Positive for nosebleeds. Negative for congestion and sore throat.   Respiratory: Negative for cough, sputum production and shortness of breath.   Cardiovascular: Negative for chest pain, orthopnea and PND.  Gastrointestinal: Negative for abdominal pain, diarrhea and vomiting.  Genitourinary: Negative for frequency.  Musculoskeletal: Negative.   Skin: Negative for itching and rash.  Neurological: Negative for dizziness, seizures and headaches.  Psychiatric/Behavioral: Positive for hallucinations.  - unable to obtain due to confusion  DRUG ALLERGIES:  No Known Allergies VITALS:  Blood pressure 132/84, pulse 93, temperature 97.8 F (36.6 C), temperature source Oral, resp. rate 16, height 5\' 3"  (1.6 m), weight 36.3 kg, SpO2 94 %. PHYSICAL EXAMINATION:  Physical Exam  Constitutional: Laying in bed in no acute distress HEENT: Cephalic, atraumatic, EOMI, no scleral icterus, moist mucous membranes Neck: Normal range of motion. Neck supple. No JVD present. No thyromegaly present.  Cardiovascular:  RRR, no murmurs, rubs, or gallops Respiratory: Effort normal and breath sounds normal. No respiratory distress. She has no wheezes. She has no rales.  GI: Soft. Bowel sounds are normal.  Nontender, nondistended. Musculoskeletal:  No pedal edema, cyanosis, clubbing Neurological: Moving all extremities.  Unable to fully assess due to confusion Skin: Skin is warm and dry. No rash noted. No erythema.  Psychiatric:  Flat mood and affect LABORATORY PANEL:  Female CBC Recent Labs  Lab 11/08/18 0517  WBC 5.5  HGB 12.0   HCT 37.7  PLT 247   ------------------------------------------------------------------------------------------------------------------ Chemistries  Recent Labs  Lab 11/05/18 1624 11/06/18 0423  11/08/18 0517  NA 142 140   < > 141  K 3.5 3.1*   < > 3.9  CL 102 103   < > 103  CO2 31 32   < > 32  GLUCOSE 105* 94   < > 88  BUN 25* 16   < > 18  CREATININE 0.61 0.42*   < > 0.49  CALCIUM 9.7 8.1*   < > 8.9  MG  --  1.7  --   --   AST 23  --   --   --   ALT 17  --   --   --   ALKPHOS 56  --   --   --   BILITOT 1.4*  --   --   --    < > = values in this interval not displayed.   RADIOLOGY:  No results found. ASSESSMENT AND PLAN:   Acute pyelonephritis- improving.  Patient has been afebrile. -Switch from IV ceftriaxone to Keflex 11/07/2018 -Urine culture growing gram-negative rods-E. coli pansensitive -PT recommending HHPT  Acute encephalopathy-secondary to delirium with possible underlying depression  CT head with age-related atrophy.  Suspect she may have a worsening of underlying dementia. -Will check HIV, RPR  ammonia-normal vitamin B12-578, folate 21.4 to rule out other etiologies TSH at 4.552 slightly related will check free T4 -Psychiatry consult placed-Dr. Viviano SimasMaurer has seen the patient discontinued Seroquel and started the patient on Haldol as needed and mirtazapine for depression.  Psychiatry will follow  up with the patient  Hypertension-blood pressure stable -Continue home metoprolol  Hyperlipidemia-stable -Continue home ezetimibe-simvastatin   All the records are reviewed and case discussed with Care Management/Social Worker. Management plans discussed with the patient, son Tim yesterday and call placed today for update and awaiting callback   CODE STATUS: Full Code  TOTAL TIME TAKING CARE OF THIS PATIENT:35 minutes.   More than 50% of the time was spent in counseling/coordination of care: YES  POSSIBLE D/C IN 1-2 DAYS, DEPENDING ON CLINICAL  CONDITION.   Ramonita Lab M.D on 11/09/2018 at 12:54 PM  Between 7am to 6pm - Pager - [phone] After 6pm go to [email or web address] - Social research officer, government  [employer] [facility]  Hospitalists  Office  [phone]  CC: Primary care physician; Alan Mulder, MD  Note: This dictation was prepared with Dragon dictation along with smaller phrase technology. Any transcriptional errors that result from this process are unintentional.

## 2018-11-10 DIAGNOSIS — N10 Acute tubulo-interstitial nephritis: Secondary | ICD-10-CM

## 2018-11-10 DIAGNOSIS — E43 Unspecified severe protein-calorie malnutrition: Secondary | ICD-10-CM

## 2018-11-10 DIAGNOSIS — R410 Disorientation, unspecified: Secondary | ICD-10-CM

## 2018-11-10 DIAGNOSIS — G9340 Encephalopathy, unspecified: Secondary | ICD-10-CM

## 2018-11-10 DIAGNOSIS — R443 Hallucinations, unspecified: Secondary | ICD-10-CM

## 2018-11-10 LAB — T4, FREE: Free T4: 1.03 ng/dL (ref 0.82–1.77)

## 2018-11-10 MED ORDER — HALOPERIDOL LACTATE 2 MG/ML PO CONC: 1.0000 mg | Freq: Two times a day (BID) | ORAL | 0 refills | Status: DC

## 2018-11-10 MED ORDER — ENSURE ENLIVE PO LIQD: 237.0000 mL | Freq: Three times a day (TID) | ORAL | 1 refills | Status: DC

## 2018-11-10 MED ORDER — MIRTAZAPINE 15 MG PO TBDP: 7.5000 mg | ORAL_TABLET | Freq: Every day | ORAL | 0 refills | Status: DC

## 2018-11-10 MED ORDER — ADULT MULTIVITAMIN W/MINERALS CH: 1.0000 | ORAL_TABLET | Freq: Every day | ORAL | Status: DC

## 2018-11-10 NOTE — Consult Note (Signed)
[facility]to[facility]Face Psychiatry Progress Note  Reason for Consult:  Change in mental status Referring Physician:  Dr. Amado CoeGouru Patient Identification: Denise Escobar MRN:  [id number] Principal Diagnosis: Acute pyelonephritis Diagnosis:  Principal Problem:   Acute pyelonephritis Active Problems:   Acute encephalopathy   Protein-calorie malnutrition, severe   Delirium   Hallucinations   Total Time spent with patient: 45 minutes  Subjective:  " I know my son is alive, but I still sometimes have thoughts that I have killed him and that people are putting things in my food."  HPI: Denise Escobar is a 85 y.o. female patient admitted with altered mental status.  She was confused and hallucinating.  In the ED she is found to have a UTI pyelonephritis on CT scan.  CT head showed some mild ventriculomegaly and was read by the radiologist has either age-related atrophy versus possible normal pressure hydrocephalus.   Psychiatry consult is requested to evaluate for depression, and for medication recommendations.  Patient is seen, and chart is reviewed.  Patient on initial evaluation is barely arousable and unable to give history.  Reevaluation is completed with son, Denise Escobar ([phone]) at bedside.  Nurses report that patient slept better last night with addition of Haldol.  She has continued to have hallucinations, fears that she is dying, and has been fearing her son is dead.    Per intake: Past Psychiatric History: Patient has "had a rough life with a history of depression, however never had medical treatment."  Patient's son describes that Denise Escobar has been living with him and his wife of 4 years (from [place]  He reports that his mother has not always approved of his marriage, however in the past few years has grown to dislike his wife, and in the past week patient has become paranoid that his wife is sending [place] hitmen to kill her and her son in order to get their money in  their property.  Son does report that there is a gun in the house, and that his mother has been stating that she should get it to protect herself from the [place].  Patient however reports that she is afraid of guns, and would never use it to harm herself or to harm others.  [name] believes that she has unintentionally murdered her son 4 times, in the past week, and requires reassurance during the exam that her son is alive and not a ghost.  She is reassured when she is able to help him.  Patient does appear anxious and tearful at points during the exam.  She and her son are agreeable for starting medication for both depression and to relieve psychosis.  Patient has been eating minimally, however with daughter-in-law at bedside she is taking protein enriched drink.  She denies suicidal and homicidal ideation.  Per son, patient has been having both auditory and visual hallucinations.  Son reports that this is a sudden change in patient's baseline.  He reports that she has not driven in 4 years, but has never had a diagnosis of dementia.  He states that at her last physical, PCP- Dr. Johny ChessMoriarty did a cognitive assessment on her in January 2020, and patient past except for missing the year.  On evaluation, patient states the year is 2019, but corrects herself.  She states the month as March, but is able to say February with prompting.  She knows her birthdate, and is able to state the past 7 presidents (past 3 without prompting and  other for with hints).  On evaluation today, patient is awake and alert, with son at bedside.  Patient is alert and oriented to person, place and time.  Her recent and remote memory are intact.  Son does express that she did not have delusions last night of killing him, however he does report that she expressed that she thought she may have killed other people overnight.  He concurs that patient status is slowly improving, and he is hopeful this will continue. Patient seems  significantly calmer and is more conversant with logical conversation today.  She continues to deny suicidal and homicidal ideation.  She is denying auditory and visual hallucinations.  Patient states that she would like to get out of bed, and sit in the chair.  She does endorse anxiety about her ability and strength for physical activity, and is hopeful for physical therapy at home.  Risk to Self:  None Risk to Others:  None Prior Inpatient Therapy:  None Prior Outpatient Therapy:  None  Past Medical History: History reviewed. No pertinent past medical history.  Past Surgical History:  Procedure Laterality Date  . HIP SURGERY  1994   Family History: No family history on file. Family Psychiatric  History: Denies  Social History:  Social History   Substance and Sexual Activity  Alcohol Use Not on file     Social History   Substance and Sexual Activity  Drug Use Not on file    Social History   Socioeconomic History  . Marital status: Widowed    Spouse name: Not on file  . Number of children: Not on file  . Years of education: Not on file  . Highest education level: Not on file  Occupational History  . Not on file  Social Needs  . Financial resource strain: Not on file  . Food insecurity:    Worry: Not on file    Inability: Not on file  . Transportation needs:    Medical: Not on file    Non-medical: Not on file  Tobacco Use  . Smoking status: Not on file  Substance and Sexual Activity  . Alcohol use: Not on file  . Drug use: Not on file  . Sexual activity: Not on file  Lifestyle  . Physical activity:    Days per week: Not on file    Minutes per session: Not on file  . Stress: Not on file  Relationships  . Social connections:    Talks on phone: Not on file    Gets together: Not on file    Attends religious service: Not on file    Active member of club or organization: Not on file    Attends meetings of clubs or organizations: Not on file    Relationship  status: Not on file  Other Topics Concern  . Not on file  Social History Narrative  . Not on file   Additional Social History:  Lives with son and daughter-in-law  Allergies:  No Known Allergies  Labs:  Results for orders placed or performed during the hospital encounter of 11/05/18 (from the past 48 hour(s))  T4, free     Status: None   Collection Time: 11/10/18  4:15 AM  Result Value Ref Range   Free T4 1.03 0.82 - 1.77 ng/dL    Comment: (NOTE) Biotin ingestion may interfere with free T4 tests. If the results are inconsistent with the TSH level, previous test results, or the clinical presentation, then consider biotin interference. If  needed, order repeat testing after stopping biotin. Performed at [facility], [address] [place], [place] [address]     Current Facility-Administered Medications  Medication Dose Route Frequency Provider Last Rate Last Dose  . acetaminophen (TYLENOL) tablet 650 mg  650 mg Oral Q6H PRN Oralia Manis, MD   650 mg at 11/06/18 2038   Or  . acetaminophen (TYLENOL) suppository 650 mg  650 mg Rectal Q6H PRN Oralia Manis, MD      . enoxaparin (LOVENOX) injection 30 mg  30 mg Subcutaneous Q24H Oralia Manis, MD   30 mg at 11/09/18 2214  . ezetimibe (ZETIA) tablet 5 mg  5 mg Oral QPM Hallaji, Sheema M, RPH   5 mg at 11/09/18 1655   And  . simvastatin (ZOCOR) tablet 20 mg  20 mg Oral q1800 Hallaji, Sheema M, RPH   20 mg at 11/09/18 1655  . feeding supplement (ENSURE ENLIVE) (ENSURE ENLIVE) liquid 237 mL  237 mL Oral TID BM Mayo, Allyn Kenner, MD   237 mL at 11/10/18 1012  . haloperidol (HALDOL) 2 MG/ML solution 1 mg  1 mg Oral BID Mariel Craft, MD   1 mg at 11/10/18 1012  . haloperidol (HALDOL) 2 MG/ML solution 2 mg  2 mg Oral QHS Mariel Craft, MD   2 mg at 11/09/18 2214  . haloperidol lactate (HALDOL) injection 2 mg  2 mg Intramuscular Q8H PRN Mariel Craft, MD      . metoprolol succinate (TOPROL-XL) 24 hr tablet 25 mg  25 mg  Oral Daily Oralia Manis, MD   25 mg at 11/09/18 0946  . mirtazapine (REMERON SOL-TAB) disintegrating tablet 7.5 mg  7.5 mg Oral QHS Mariel Craft, MD   7.5 mg at 11/09/18 2214  . montelukast (SINGULAIR) tablet 10 mg  10 mg Oral Daily Oralia Manis, MD   10 mg at 11/10/18 1013  . multivitamin with minerals tablet 1 tablet  1 tablet Oral Daily Mayo, Allyn Kenner, MD   1 tablet at 11/10/18 1013  . ondansetron (ZOFRAN) tablet 4 mg  4 mg Oral Q6H PRN Oralia Manis, MD       Or  . ondansetron [facility]) injection 4 mg  4 mg Intravenous Q6H PRN Oralia Manis, MD      . sodium chloride flush (NS) 0.9 % injection 3 mL  3 mL Intravenous Once Oralia Manis, MD        Musculoskeletal: Strength & Muscle Tone: decreased Gait & Station: Currently requires assistance Patient leans: Right  Psychiatric Specialty Exam: Physical Exam  Nursing note and vitals reviewed. Constitutional: She appears well-developed and well-nourished. No distress.  HENT:  Head: Normocephalic and atraumatic.  Eyes: EOM are normal.  Neck: Normal range of motion.  Cardiovascular: Normal rate.  Respiratory: Effort normal.  Musculoskeletal: Normal range of motion.  Neurological: She is alert.  Skin: Skin is warm and dry.    Review of Systems  Constitutional: Positive for malaise/fatigue and weight loss.       Decreased appetite  HENT: Positive for hearing loss.   Eyes:       Decreased vision, wears glasses  Respiratory: Negative.   Cardiovascular: Negative.   Gastrointestinal: Negative.   Genitourinary:       Urinary incontinance  Musculoskeletal: Negative.   Skin: Negative.   Neurological: Positive for weakness.       Confused intermittently with delirium which is clearing.  Psychiatric/Behavioral: Positive for depression, hallucinations and memory loss (sudden). Negative for substance  abuse and suicidal ideas. The patient is nervous/anxious. The patient does not have insomnia.     Blood pressure (!) 92/53, pulse 62,  temperature (!) 97.5 F (36.4 C), temperature source Oral, resp. rate 18, height 5\' 3"  (1.6 m), weight 36.3 kg, SpO2 95 %.Body mass index is 14.18 kg/m.  General Appearance: thin and frail  Eye Contact:  Good  Speech:  Clear and Coherent  Volume:  Decreased  Mood:  Anxious and Dysphoric  Affect:  Appropriate  Thought Process:  Descriptions of Associations: Intact  Orientation:  Full (Time, Place, and Person)  Thought Content:  Delusions and Hallucinations: None  Suicidal Thoughts:  No  Homicidal Thoughts:  No  Memory:  fluctuating, but improved for sustained periods.  Judgement:  Fair  Insight:  Lacking  Psychomotor Activity:  Normal  Concentration:  Concentration: Fair  Recall:  Poor  Fund of Knowledge:  Fair  Language:  Fair  Akathisia:  No  Handed:  Right  AIMS (if indicated):   0  Assets:  Financial Resources/Insurance Housing Social Support  ADL's:  Impaired  Cognition:  Impaired,  Moderate  Sleep:   Fluctuating     Treatment Plan Summary:  JAREE NASIR is a 85 y.o. female with no known past psychiatric history, who presented to the emergency room with acute change in altered mental status in the setting of pyelonephritis.  Patient symptoms clinically correlate with episode of delirium.  Patient likely has an underlying depression.  Medication recommendations below.  Patient and family made aware that patient is at higher risk for future delirium events once first episode of delirium has occurred.   Medication management Continue mirtazapine 7.5 mg daily at bedtime for depression and to maximize side effect profile of sedation and appetite stimulation at lower dose; Discontinue Seroquel and changed to Haldol 2 mg/mL solution giving 2 mg at bedtime;  Orders changed to schedule Haldol 2 mg/mL solution 1 mg morning and afternoon for agitation, delusions and hallucinations.  Monitor patient for excessive sedation, or dystonia.  We will avoid Cogentin and Benadryl at this  time for dystonia prevention, as this can have cognitive blunting which would worsen her delirium.  Disposition: No evidence of imminent risk to self or others at present.   Patient does not meet criteria for psychiatric inpatient admission. Supportive therapy provided about ongoing stressors. Educated family on delirium and depression, reviewed medications with explanation of risks, benefits, side effects, and adverse effects to include black box warnings   Thank you for allowing me to assist in the care of this lovely lady and her family.   Mariel Craft, MD 11/10/2018 3:06 PM

## 2018-11-10 NOTE — Care Management Important Message (Signed)
Important Message  Patient Details  Name: Denise Escobar MRN: [id number] Date of Birth: [birth year]   Medicare Important Message Given:  Other (see comment)  Patient not able to sign and no family in the room.  Left a copy of the Important Message at bedside.  Olegario Messier A Malva Diesing 11/10/2018, 11:43 AM

## 2018-11-10 NOTE — Discharge Summary (Signed)
[facility] [place] [facility]   PATIENT NAME: Denise CleaverMamie Escobar    MR#:  [id number]  DATE OF BIRTH:  04/28/1932  DATE OF ADMISSION:  11/05/2018 ADMITTING PHYSICIAN: Oralia Manisavid Willis, MD  DATE OF DISCHARGE:  11/10/18   PRIMARY CARE PHYSICIAN: Alan MulderMorayati, Shamil J, MD    ADMISSION DIAGNOSIS:  Pyelonephritis [N12] Altered mental status, unspecified altered mental status type [R41.82]  DISCHARGE DIAGNOSIS:  Principal Problem:   Acute pyelonephritis Active Problems:   Acute encephalopathy   Protein-calorie malnutrition, severe   Delirium   Hallucinations   SECONDARY DIAGNOSIS:  History reviewed. No pertinent past medical history.  HOSPITAL COURSE:  hpi  Denise Escobar  is a 85 y.o. female who presents with chief complaint as above.  Patient presents with altered mental status.  She was confused and hallucinating.  Here in the ED she is found to have a UTI pyelonephritis on CT scan.  CT head showed some mild ventriculomegaly and was read by the radiologist has either age-related atrophy versus possible normal pressure hydrocephalus.  Hospitalist called for admission  Acute pyelonephritis- improving.  Patient has been afebrile. -Switch from IV ceftriaxone to Keflex 11/07/2018, completed antibiotic course -Urine culture growing gram-negative rods-E. coli pansensitive -PT recommending HHPT  Acute encephalopathy-secondary to delirium with possible underlying depression  CT head with age-related atrophy.  Suspect she may have a worsening of underlying dementia. -Will check HIV, RPR  ammonia-normal vitamin B12-578, folate 21.4 to rule out other etiologies TSH at 4.552 slightly related will check free T4 -Psychiatry consult placed-Dr. Viviano SimasMaurer has seen the patient discontinued Seroquel and started the patient on Haldol as needed and mirtazapine for depression.  Clinically better today okay to discharge patient from psych standpoint  Hypertension-blood pressure  stable -Continue home metoprolol  Hyperlipidemia-stable -Continue home ezetimibe-simvastatin  Discharge home with home health physical therapy and supervision Plan of care discussed with team patient son and daughter-in-law over phone DISCHARGE CONDITIONS:   Fair  CONSULTS OBTAINED:  Treatment Team:  Mariel CraftMaurer, Sheila M, MD   PROCEDURES none  DRUG ALLERGIES:  No Known Allergies  DISCHARGE MEDICATIONS:   Allergies as of 11/10/2018   No Known Allergies     Medication List    TAKE these medications   calcium-vitamin D 250-125 MG-UNIT tablet Commonly known as:  OSCAL WITH D Take 1 tablet by mouth daily.   ezetimibe-simvastatin 10-40 MG tablet Commonly known as:  VYTORIN Take 0.5 tablets by mouth daily.   feeding supplement (ENSURE ENLIVE) Liqd Take 237 mLs by mouth 3 (three) times daily between meals.   gabapentin 600 MG tablet Commonly known as:  NEURONTIN Take 600 mg by mouth 2 (two) times daily.   haloperidol 2 MG/ML solution Commonly known as:  HALDOL Take 0.5 mLs (1 mg total) by mouth 2 (two) times daily. Haldol 1 mg 2 times daily and 2 mg at night on daily basis   mirtazapine 15 MG disintegrating tablet Commonly known as:  REMERON SOL-TAB Take 0.5 tablets (7.5 mg total) by mouth at bedtime.   montelukast 10 MG tablet Commonly known as:  SINGULAIR Take 10 mg by mouth daily.   multivitamin with minerals Tabs tablet Take 1 tablet by mouth daily. Start taking on:  November 11, 2018   TOPROL XL 50 MG 24 hr tablet Generic drug:  metoprolol succinate Take 25 mg by mouth daily.   Vitamin D (Ergocalciferol) 1.25 MG (50000 UT) Caps capsule Commonly known as:  DRISDOL Take 50,000 Units by mouth daily.  DISCHARGE INSTRUCTIONS:   Follow-up with primary care physician in 3 days Follow-up with psychiatrist in 2 weeks  DIET:  Cardiac diet  DISCHARGE CONDITION:  Fair  ACTIVITY:  Activity as tolerated  OXYGEN:  Home Oxygen: No.   Oxygen  Delivery: room air  DISCHARGE LOCATION:  home   If you experience worsening of your admission symptoms, develop shortness of breath, life threatening emergency, suicidal or homicidal thoughts you must seek medical attention immediately by calling 911 or calling your MD immediately  if symptoms less severe.  You Must read complete instructions/literature along with all the possible adverse reactions/side effects for all the Medicines you take and that have been prescribed to you. Take any new Medicines after you have completely understood and accpet all the possible adverse reactions/side effects.   Please note  You were cared for by a hospitalist during your hospital stay. If you have any questions about your discharge medications or the care you received while you were in the hospital after you are discharged, you can call the unit and asked to speak with the hospitalist on call if the hospitalist that took care of you is not available. Once you are discharged, your primary care physician will handle any further medical issues. Please note that NO REFILLS for any discharge medications will be authorized once you are discharged, as it is imperative that you return to your primary care physician (or establish a relationship with a primary care physician if you do not have one) for your aftercare needs so that they can reassess your need for medications and monitor your lab values.     Today  Chief Complaint  Patient presents with  . Altered Mental Status   Patient is doing much better and answering questions appropriately Seen by psychiatry and physical therapy today.  Discussed with son and agreeable to discharge patient home  ROS:  CONSTITUTIONAL: Denies fevers, chills. Denies any fatigue, weakness.  EYES: Denies blurry vision, double vision, eye pain. EARS, NOSE, THROAT: Denies tinnitus, ear pain, hearing loss. RESPIRATORY: Denies cough, wheeze, shortness of breath.  CARDIOVASCULAR:  Denies chest pain, palpitations, edema.  GASTROINTESTINAL: Denies nausea, vomiting, diarrhea, abdominal pain. Denies bright red blood per rectum. GENITOURINARY: Denies dysuria, hematuria. ENDOCRINE: Denies nocturia or thyroid problems. HEMATOLOGIC AND LYMPHATIC: Denies easy bruising or bleeding. SKIN: Denies rash or lesion. MUSCULOSKELETAL: Denies pain in neck, back, shoulder, knees, hips or arthritic symptoms.  NEUROLOGIC: Denies paralysis, paresthesias.  PSYCHIATRIC: Denies anxiety or depressive symptoms.   VITAL SIGNS:  Blood pressure (!) 92/53, pulse 62, temperature (!) 97.5 F (36.4 C), temperature source Oral, resp. rate 18, height 5\' 3"  (1.6 m), weight 36.3 kg, SpO2 95 %.  I/O:    Intake/Output Summary (Last 24 hours) at 11/10/2018 1408 Last data filed at 11/10/2018 0941 Gross per 24 hour  Intake 477 ml  Output -  Net 477 ml    PHYSICAL EXAMINATION:  GENERAL:  85 y.o.-year-old patient lying in the bed with no acute distress.  EYES: Pupils equal, round, reactive to light and accommodation. No scleral icterus. Extraocular muscles intact.  HEENT: Head atraumatic, normocephalic. Oropharynx and nasopharynx clear.  NECK:  Supple, no jugular venous distention. No thyroid enlargement, no tenderness.  LUNGS: Normal breath sounds bilaterally, no wheezing, rales,rhonchi or crepitation. No use of accessory muscles of respiration.  CARDIOVASCULAR: S1, S2 normal. No murmurs, rubs, or gallops.  ABDOMEN: Soft, non-tender, non-distended. Bowel sounds present.  EXTREMITIES: No pedal edema, cyanosis, or clubbing.  NEUROLOGIC: Awake, alert and  oriented x3. Sensation intact. Gait not checked.  PSYCHIATRIC: The patient is alert and oriented x 3.  SKIN: No obvious rash, lesion, or ulcer.   DATA REVIEW:   CBC Recent Labs  Lab 11/08/18 0517  WBC 5.5  HGB 12.0  HCT 37.7  PLT 247    Chemistries  Recent Labs  Lab 11/05/18 1624 11/06/18 0423  11/08/18 0517  NA 142 140   < > 141  K  3.5 3.1*   < > 3.9  CL 102 103   < > 103  CO2 31 32   < > 32  GLUCOSE 105* 94   < > 88  BUN 25* 16   < > 18  CREATININE 0.61 0.42*   < > 0.49  CALCIUM 9.7 8.1*   < > 8.9  MG  --  1.7  --   --   AST 23  --   --   --   ALT 17  --   --   --   ALKPHOS 56  --   --   --   BILITOT 1.4*  --   --   --    < > = values in this interval not displayed.    Cardiac Enzymes Recent Labs  Lab 11/05/18 1821  TROPONINI <0.03    Microbiology Results  Results for orders placed or performed during the hospital encounter of 11/05/18  Urine Culture     Status: Abnormal   Collection Time: 11/05/18 12:40 AM  Result Value Ref Range Status   Specimen Description   Final    URINE, CLEAN CATCH Performed at [facility], [address] [place], [place] [address]    Special Requests   Final    Normal Performed at [facility], [address] [place], [place] [address]    Culture >=100,000 COLONIES/mL ESCHERICHIA COLI (A)  Final   Report Status 11/08/2018 FINAL  Final   Organism ID, Bacteria ESCHERICHIA COLI (A)  Final      Susceptibility   Escherichia coli - MIC*    AMPICILLIN 4 SENSITIVE Sensitive     CEFAZOLIN <=4 SENSITIVE Sensitive     CEFTRIAXONE <=1 SENSITIVE Sensitive     CIPROFLOXACIN <=0.25 SENSITIVE Sensitive     GENTAMICIN <=1 SENSITIVE Sensitive     IMIPENEM <=0.25 SENSITIVE Sensitive     NITROFURANTOIN <=16 SENSITIVE Sensitive     TRIMETH/SULFA <=20 SENSITIVE Sensitive     AMPICILLIN/SULBACTAM <=2 SENSITIVE Sensitive     PIP/TAZO <=4 SENSITIVE Sensitive     Extended ESBL NEGATIVE Sensitive     * >=100,000 COLONIES/mL ESCHERICHIA COLI    RADIOLOGY:  No results found.  EKG:   Orders placed or performed during the hospital encounter of 11/05/18  . ED EKG  . ED EKG      Management plans discussed with the patient, son at bedside daughter-in-law over phone and they are in agreement.  CODE STATUS:     Code Status Orders  (From admission, onward)          Start     Ordered   11/06/18 0218  Full code  Continuous     11/06/18 0217        Code Status History    This patient has a current code status but no historical code status.      TOTAL TIME TAKING CARE OF THIS PATIENT: 43 minutes.   Note: This dictation was prepared with Dragon dictation along with smaller phrase technology.  Any transcriptional errors that result from this process are unintentional.   @[facility] @  on 11/10/2018 at 2:08 PM  Between 7am to 6pm - Pager - [phone]  After 6pm go to [email or web address] - password [email or web address] [facility]  [place]  [facility]  Office  [phone]  CC: Primary care physician; Alan Mulder, MD

## 2018-11-10 NOTE — Discharge Instructions (Signed)
Confusion Confusion is the inability to think with the usual speed or clarity. People who are confused often describe their thinking as cloudy or unclear. Confusion can also include feeling disoriented. This means you are unaware of where you are or who you are. You may also not know the date or time. When confused, you may have difficulty remembering, paying attention, or making decisions. Some people also act aggressively when they are confused. In some cases, confusion may come on quickly. In other cases, it may develop slowly over time. How quickly confusion comes on depends on the cause. Confusion may be caused by:  Head injury (concussion).  Seizures.  Stroke.  Fever.  Brain tumor.  Decrease in brain function due to a vascular or neurologic condition (dementia).  Emotions, like rage or terror.  Inability to know what is real and what is not (hallucinations).  Infections, such as a urinary tract infection (UTI).  Using too much alcohol, drugs, or medicines.  Loss of fluid (dehydration) or an imbalance of salts in the body (electrolytes).  Lack of sleep.  Low blood sugar (diabetes).  Low levels of oxygen. This comes from conditions such as chronic lung disorders.  Side effects of medicines, or taking medicines that affect other medicines (drug interactions).  Lack of certain nutrients, especially niacin, thiamine, vitamin C, or vitamin B.  Sudden drop in body temperature (hypothermia).  Change in routine, such as traveling or being hospitalized. Follow these instructions at home: Pay attention to your symptoms. Tell your health care provider about any changes or if you develop new symptoms. Follow these instructions to control or treat symptoms. Ask a family member or friend for help if needed. Medicines  Take over-the-counter and prescription medicines only as told by your health care provider.  Ask your health care provider about changing or stopping any  medicines that may be causing your confusion.  Avoid pain medicines or sleep medicines until you have fully recovered.  Use a pillbox or an alarm to help you take the right medicines at the right time. Lifestyle   Eat a balanced diet that includes fruits and vegetables.  Get enough sleep. For most adults, this is 7-9 hours each night.  Do not drink alcohol.  Do not become isolated. Spend time with other people and make plans for your days.  Do not drive until your health care provider says that it is safe to do so.  Do not use any products that contain nicotine or tobacco, such as cigarettes and e-cigarettes. If you need help quitting, ask your health care provider.  Stop other activities that may increase your chances of getting hurt. These may include some work duties, sports activities, swimming, or bike riding. Ask your health care provider what activities are safe for you. What caregivers can do  Find out if the person is confused. Ask the person to state his or her name, age, and the date. If the person is unsure or answers incorrectly, he or she may be confused.  Always introduce yourself, no matter how well the person knows you.  Remind the person of his or her location. Do this often.  Place a calendar and clock near the person who is confused.  Talk about current events and plans for the day.  Keep the environment calm, quiet, and peaceful.  Help the person do the things that he or she is unable to do. These include: ? Taking medicines. ? Keeping follow-up visits with his or her health  care provider. ? Helping with household duties, including meal preparation. ? Running errands.  Get help if you need it. There are several support groups for caregivers.  If the person you are helping needs more support, consider day care, extended care programs, or a skilled nursing facility. The person's health care provider may be able to help evaluate these options. General  instructions  Monitor yourself for any conditions you may have. These may include: ? Checking your blood glucose levels, if you have diabetes. ? Watching your weight, if you are overweight. ? Monitoring your blood pressure, if you have hypertension. ? Monitoring your body temperature, if you have a fever.  Keep all follow-up visits as told by your health care provider. This is important. Contact a health care provider if:  Your symptoms get worse. Get help right away if you:  Feel that you are not able to care for yourself.  Develop severe headaches, repeated vomiting, seizures, blackouts, or slurred speech.  Have increasing confusion, weakness, numbness, restlessness, or personality changes.  Develop a loss of balance, have marked dizziness, feel uncoordinated, or fall.  Develop severe anxiety, or you have delusions or hallucinations. These symptoms may represent a serious problem that is an emergency. Do not wait to see if the symptoms will go away. Get medical help right away. Call your local emergency services (911 in the [place] Do not drive yourself to the hospital. Summary  Confusion is the inability to think with the usual speed or clarity. People who are confused often describe their thinking as cloudy or unclear.  Confusion can also include having difficulty remembering, paying attention, or making decisions.  Confusion may come on quickly or develop slowly over time, depending on the cause. There are many different causes of confusion.  Ask for help from family members or friends if you are unable to take care of yourself. This information is not intended to replace advice given to you by your health care provider. Make sure you discuss any questions you have with your health care provider. Document Released: 10/25/2004 Document Revised: 09/19/2017 Document Reviewed: 09/19/2017 Elsevier Interactive Patient Education  2019 [employer] Inc[employer]   Pyelonephritis,  Adult Pyelonephritis is a kidney infection. The kidneys are the organs that filter a person's blood and move waste out of the bloodstream and into the urine. Urine passes from the kidneys, through the ureters, and into the bladder. There are two main types of pyelonephritis:  Infections that come on quickly without any warning (acute pyelonephritis).  Infections that last for a long period of time (chronic pyelonephritis). In most cases, the infection clears up with treatment and does not cause further problems. More severe infections or chronic infections can sometimes spread to the bloodstream or lead to other problems with the kidneys. What are the causes? This condition is usually caused by:  Bacteria traveling from the bladder to the kidney through infected urine. The urine in the bladder can become infected with bacteria from: ? Bladder infection (cystitis). ? Inflammation of the prostate gland (prostatitis). ? Sexual intercourse, in females.  Bacteria traveling from the bloodstream to the kidney. What increases the risk? This condition is more likely to develop in:  Pregnant women.  Older people.  People who have diabetes.  People who have kidney stones or bladder stones.  People who have other abnormalities of the kidney or ureter.  People who have a catheter placed in the bladder.  People who have cancer.  People who are sexually active.  Women  who use spermicides.  People who have had a prior urinary tract infection. What are the signs or symptoms? Symptoms of this condition include:  Frequent urination.  Strong or persistent urge to urinate.  Burning or stinging when urinating.  Abdominal pain.  Back pain.  Pain in the side or flank area.  Fever.  Chills.  Blood in the urine, or dark urine.  Nausea.  Vomiting. How is this diagnosed? This condition may be diagnosed based on:  Medical history and physical exam.  Urine tests.  Blood  tests. You may also have imaging tests of the kidneys, such as an ultrasound or CT scan. How is this treated? Treatment for this condition may depend on the severity of the infection.  If the infection is mild and is found early, you may be treated with antibiotic medicines taken by mouth. You will need to drink fluids to remain hydrated.  If the infection is more severe, you may need to stay in the hospital and receive antibiotics given directly into a vein through an IV tube. You may also need to receive fluids through an IV tube if you are not able to remain hydrated. After your hospital stay, you may need to take oral antibiotics for a period of time. Other treatments may be required, depending on the cause of the infection. Follow these instructions at home: Medicines  Take over-the-counter and prescription medicines only as told by your health care provider.  If you were prescribed an antibiotic medicine, take it as told by your health care provider. Do not stop taking the antibiotic even if you start to feel better. General instructions  Drink enough fluid to keep your urine clear or pale yellow.  Avoid caffeine, tea, and carbonated beverages. They tend to irritate the bladder.  Urinate often. Avoid holding in urine for long periods of time.  Urinate before and after sex.  After a bowel movement, women should cleanse from front to back. Use each tissue only once.  Keep all follow-up visits as told by your health care provider. This is important. Contact a health care provider if:  Your symptoms do not get better after 2 days of treatment.  Your symptoms get worse.  You have a fever. Get help right away if:  You are unable to take your antibiotics or fluids.  You have shaking chills.  You vomit.  You have severe flank or back pain.  You have extreme weakness or fainting. This information is not intended to replace advice given to you by your health care provider.  Make sure you discuss any questions you have with your health care provider. Document Released: 09/17/2005 Document Revised: 02/23/2016 Document Reviewed: 01/10/2015 Elsevier Interactive Patient Education  2019 [employer]. Follow-up with primary care physician in 3 days Follow-up with psychiatrist in 2 weeks

## 2018-11-10 NOTE — Plan of Care (Signed)
  Problem: Urinary Elimination: Goal: Signs and symptoms of infection will decrease Outcome: Progressing   Problem: Education: Goal: Knowledge of General Education information will improve Description Including pain rating scale, medication(s)/side effects and non-pharmacologic comfort measures Outcome: Progressing   Problem: Health Behavior/Discharge Planning: Goal: Ability to manage health-related needs will improve Outcome: Progressing   Problem: Clinical Measurements: Goal: Ability to maintain clinical measurements within normal limits will improve Outcome: Progressing Goal: Will remain free from infection Outcome: Progressing Goal: Diagnostic test results will improve Outcome: Progressing Goal: Respiratory complications will improve Outcome: Progressing Goal: Cardiovascular complication will be avoided Outcome: Progressing   Problem: Activity: Goal: Risk for activity intolerance will decrease Outcome: Progressing   Problem: Nutrition: Goal: Adequate nutrition will be maintained Outcome: Progressing   Problem: Coping: Goal: Level of anxiety will decrease Outcome: Progressing   Problem: Elimination: Goal: Will not experience complications related to bowel motility Outcome: Progressing Goal: Will not experience complications related to urinary retention Outcome: Progressing   Problem: Pain Managment: Goal: General experience of comfort will improve Outcome: Progressing   Problem: Safety: Goal: Ability to remain free from injury will improve Outcome: Progressing   Problem: Skin Integrity: Goal: Risk for impaired skin integrity will decrease Outcome: Progressing  Cont confused and hall. Abx  given po

## 2018-11-10 NOTE — Progress Notes (Signed)
Pt for discharge home with hh/pt/ alert/ dementia. Sl d/cd arlier. Instructions given to son bill. Meds/ presc/ diet  Activity and f/u discussed. Verbalizes understanding

## 2018-11-10 NOTE — Care Management Note (Signed)
Case Management Note  Patient Details  Name: Denise Escobar MRN: [id number] Date of Birth: Dec 16, 1931  Subjective/Objective:  Patient admitted with acute pyelonephritis.  Patient lives with son and his wife.  PCP verified as Dr. Gillermina Phy.  Home Health Agency choice offered and son chooses [facility].  Feliberto Gottron with [facility] is accepting the referral for SN, PT, Aide, and SW.  Son is exhibiting care giver roll strain, he is very frustrated with his mother's mental decline.  RNCM referred the son to [facility] for outpatient case management and Caregiver support resources, number given to son and he states he will follow up.  Patient has equipment at home, walker, bedside commode and lift chair which is what patient sleeps in because she needs to have HOB elevated.  Patient is ready for discharge, son is providing transportation.  RNCM signed off.  Robbie Lis RN BSN [phone]                       Action/Plan:   Expected Discharge Date:  11/10/18               Expected Discharge Plan:  Home w Home Health Services  In-House Referral:  Clinical Social Work  Discharge planning Services  CM Consult  Post Acute Care Choice:  Home Health Choice offered to:  Adult Children  DME Arranged:    DME Agency:  [facility]  HH Arranged:  RN, PT, Nurse's Aide, Social Work [employer] Agency:  [facility]  Status of Service:  Completed, signed off  If discussed at [employer], dates discussed:    Additional Comments:  Allayne Butcher, RN 11/10/2018, 4:20 PM

## 2018-11-10 NOTE — Progress Notes (Signed)
Physical Therapy Treatment Patient Details Name: Denise Escobar MRN: [id number] DOB: Jul 05, 1932 Today's Date: 11/10/2018    History of Present Illness Pt is an 85 year old female admitted for UTI following c/o weakness.  No PMH listed.    PT Comments    Pt agreeable to PT; denies pain. Pt participates with BLE exercises with assist as needed; slow moving and decreased range throughout. Pt states she has been moving her legs "a lot". Pt agreeable to up in bed/to chair requiring Min A for supine to/from sit. Upon sitting pt has bowel movement requiring personal care. Pt returned to supine. Nursing notified. Continue PT to progress strength to improve functional mobility and allow for an optimal, safe return home.   Follow Up Recommendations  Home health PT;Supervision - Intermittent     Equipment Recommendations  None recommended by PT    Recommendations for Other Services       Precautions / Restrictions Precautions Precautions: Fall Restrictions Weight Bearing Restrictions: No    Mobility  Bed Mobility Overal bed mobility: Needs Assistance Bed Mobility: Supine to Sit     Supine to sit: Min assist     General bed mobility comments: HHA for trunk  Transfers                 General transfer comment: Deferred due to having BM and needing personal care  Ambulation/Gait                 Stairs             Wheelchair Mobility    Modified Rankin (Stroke Patients Only)       Balance                                            Cognition Arousal/Alertness: Awake/alert Behavior During Therapy: WFL for tasks assessed/performed Overall Cognitive Status: Within Functional Limits for tasks assessed                                        Exercises General Exercises - Lower Extremity Ankle Circles/Pumps: AROM;Both;10 reps;Supine Quad Sets: Strengthening;Both;10 reps;Supine Gluteal Sets: Strengthening;Both;10  reps;Supine Short Arc Quad: AAROM;AROM;5 reps;10 reps;Supine Heel Slides: AROM;AAROM;Both;10 reps;Supine Hip ABduction/ADduction: AAROM;Both;10 reps;Supine Straight Leg Raises: AAROM;Both;10 reps;Supine    General Comments        Pertinent Vitals/Pain Pain Assessment: No/denies pain    Home Living                      Prior Function            PT Goals (current goals can now be found in the care plan section) Progress towards PT goals: (reassess next visit)    Frequency    Min 2X/week      PT Plan Current plan remains appropriate    Co-evaluation              AM-PAC PT "6 Clicks" Mobility   Outcome Measure  Help needed turning from your back to your side while in a flat bed without using bedrails?: None Help needed moving from lying on your back to sitting on the side of a flat bed without using bedrails?: A Little Help needed moving to and from a bed to a chair (  including a wheelchair)?: A Little Help needed standing up from a chair using your arms (e.g., wheelchair or bedside chair)?: A Little Help needed to walk in hospital room?: A Little Help needed climbing 3-5 steps with a railing? : A Lot 6 Click Score: 18    End of Session   Activity Tolerance: Patient limited by fatigue;Other (comment)(requiring personal care) Patient left: in bed;with call bell/phone within reach;with bed alarm set Nurse Communication: Other (comment)(need for personal care) PT Visit Diagnosis: Unsteadiness on feet (R26.81);Other abnormalities of gait and mobility (R26.89);Muscle weakness (generalized) (M62.81)     Time: 9563-8756 PT Time Calculation (min) (ACUTE ONLY): 24 min  Charges:  $Therapeutic Exercise: 8-22 mins $Therapeutic Activity: 8-22 mins                      Scot Dock, PTA 11/10/2018, 1:57 PM

## 2018-11-11 LAB — RPR
RPR Ser Ql: NONREACTIVE
RPR Ser Ql: NONREACTIVE

## 2018-11-11 LAB — HIV ANTIBODY (ROUTINE TESTING W REFLEX): HIV Screen 4th Generation wRfx: NONREACTIVE

## 2018-11-13 ENCOUNTER — Telehealth: Payer: Self-pay

## 2018-11-13 NOTE — Telephone Encounter (Signed)
EMMI Follow-up: Noted on the report that the patient didn't know who to call if there was a change in her condition.  I called Denise Escobar but her son, Denise Escobar (caretaker) said she was not doing well.  Had an appointment with PCP today and condition worsens.  Son states, she doesn't know what is going on anymore.  Asked to have his phone number as primary contact.  Updated in Epic.

## 2019-07-28 IMAGING — CT CT HEAD W/O CM
2 of 5 series · 15 of 40 positions shown, 18 images · non-contrast
Comparison: None.

CLINICAL DATA: Unexplained alteration of consciousness.

EXAM:
CT HEAD WITHOUT CONTRAST
TECHNIQUE: Contiguous axial images were obtained from the base of the skull
through the vertex without intravenous contrast.

[Series 8: head 2.0 h70h · axial · 0.40mm/px · z∈[+272,+402]mm · 12 of 76 slices shown, 15 images]
[im 6/76  brain]
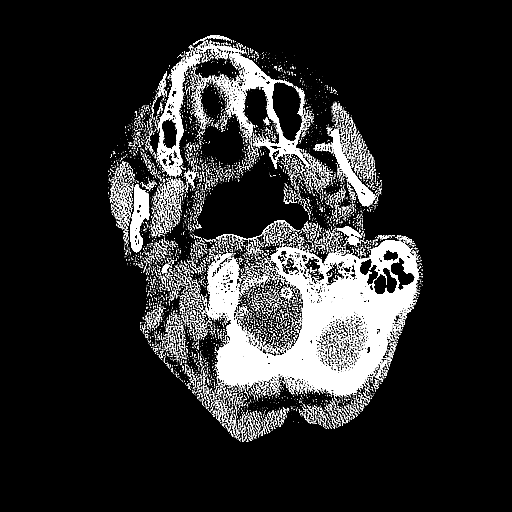
[im 6/76  bone]
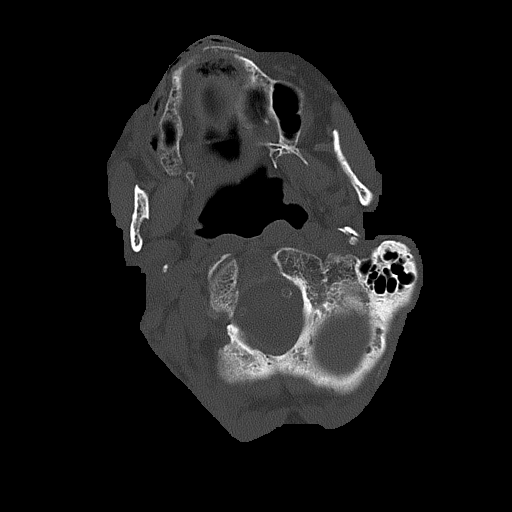
[im 11/76  brain]
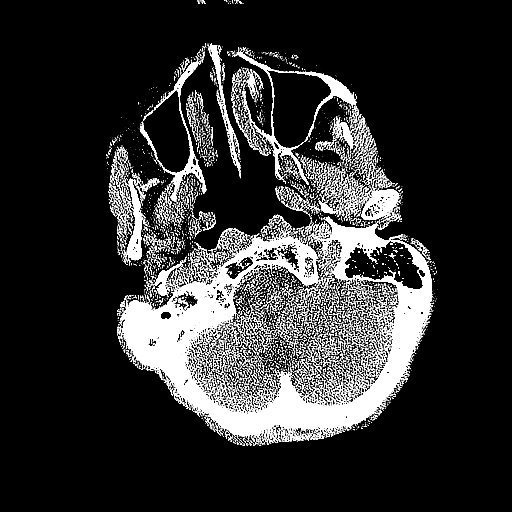
[im 16/76  brain]
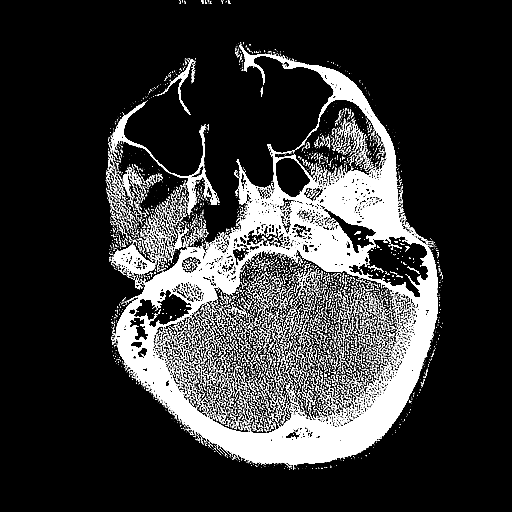
[im 26/76  brain]
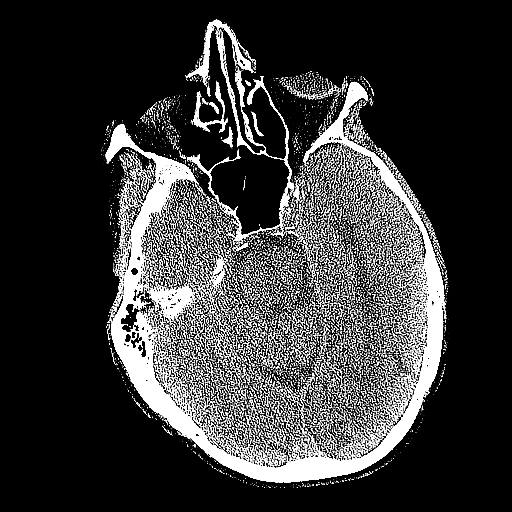
[im 31/76  brain]
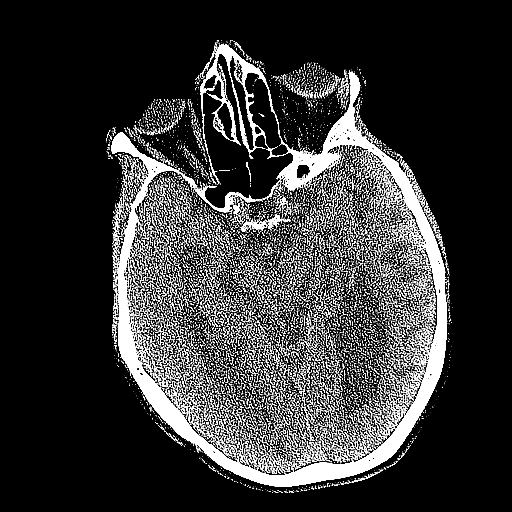
[im 31/76  bone]
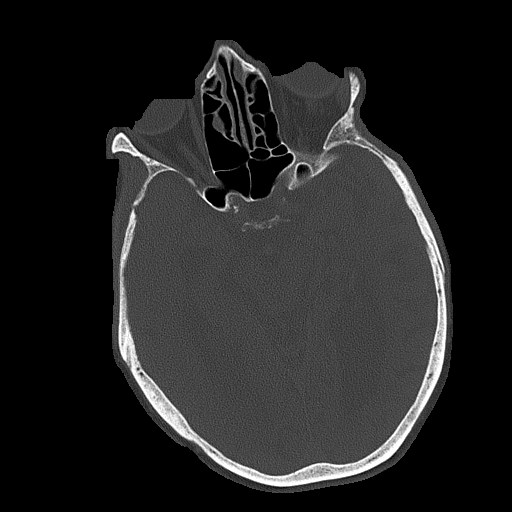
[im 36/76  brain]
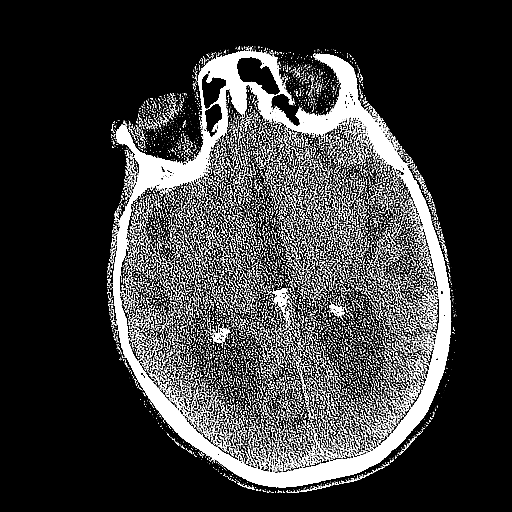
[im 41/76  brain]
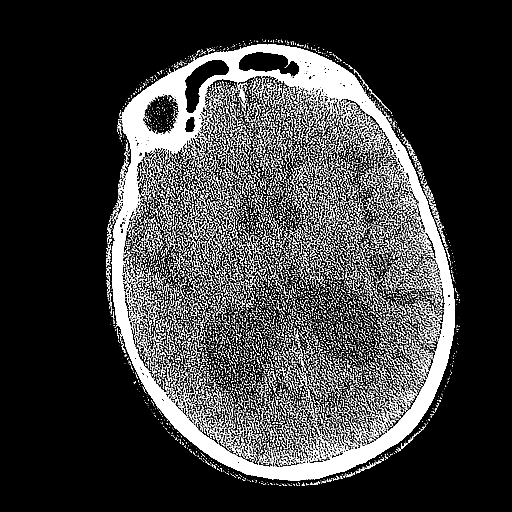
[im 46/76  brain]
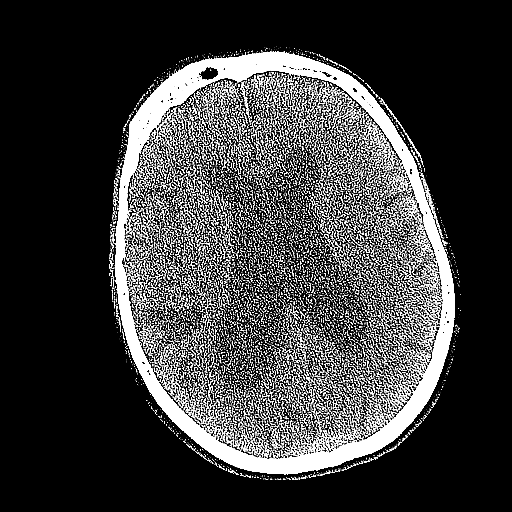
[im 51/76  brain]
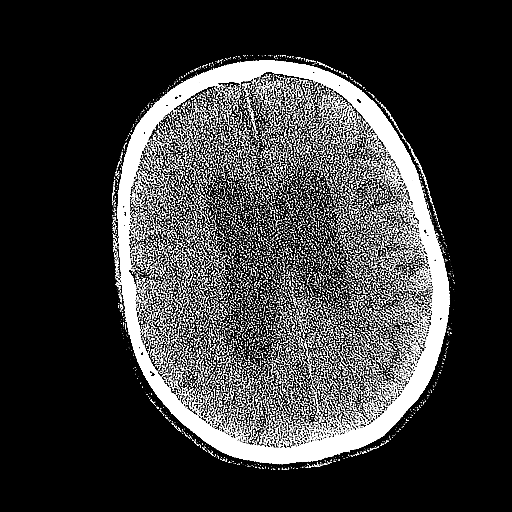
[im 51/76  bone]
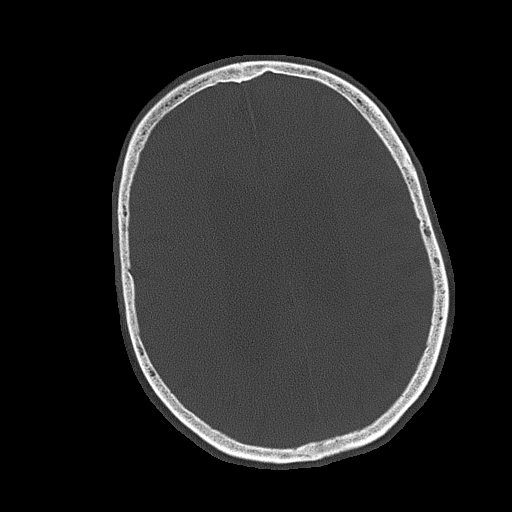
[im 61/76  brain]
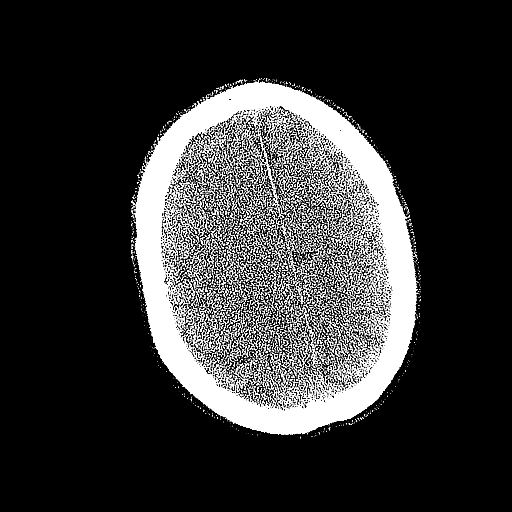
[im 66/76  brain]
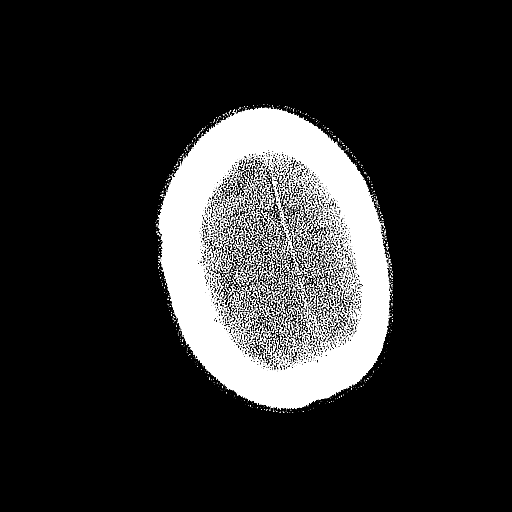
[im 71/76  brain]
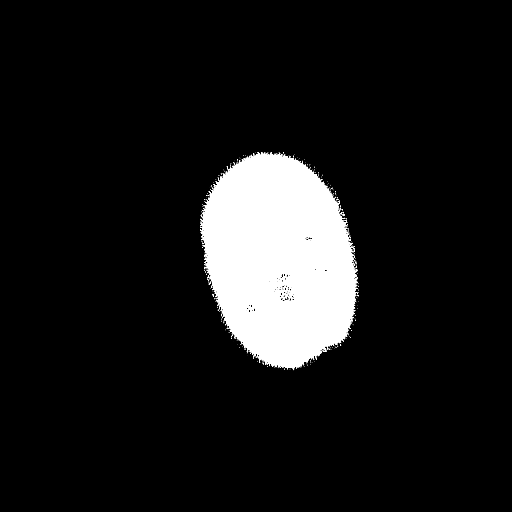

[Series 9: head cor · coronal · 0.33mm/px · 3 of 40 slices shown]
[im 14/40  brain]
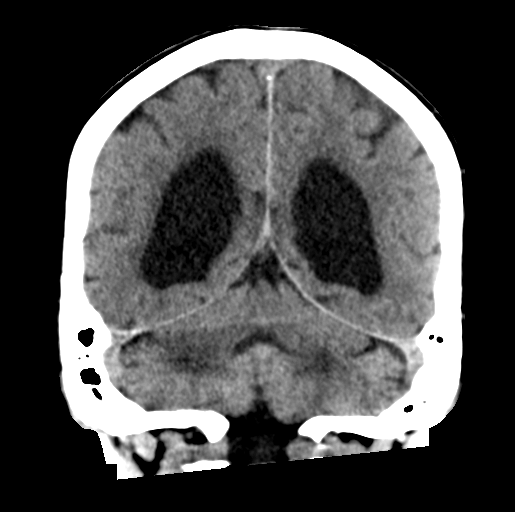
[im 18/40  brain]
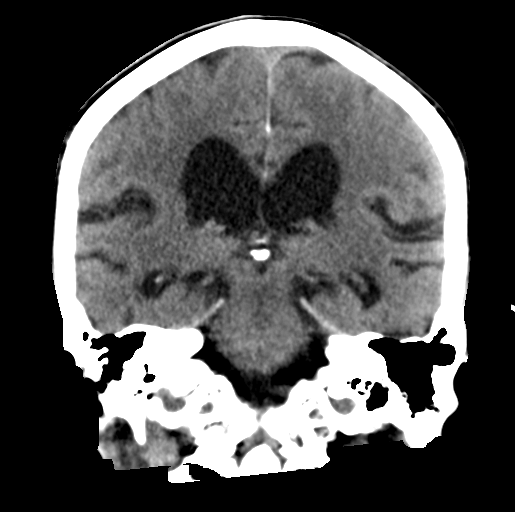
[im 22/40  brain]
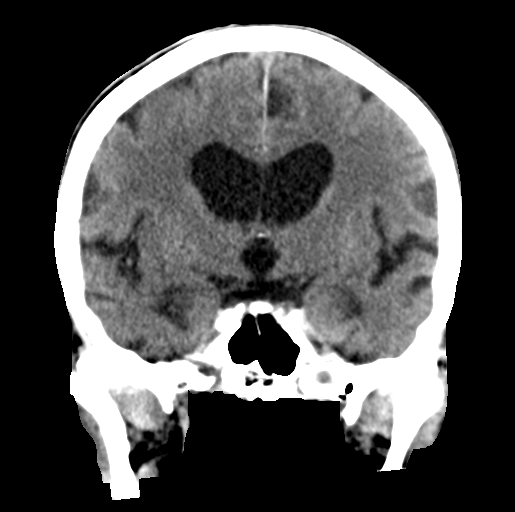

[15 of 40 positions shown; findings below may reference images not displayed]

FINDINGS: Brain: Moderate to advanced ventriculomegaly, with disproportionate
lack of prominence of the cortical sulci. Basilar cisterns are
patulous. It is unclear if this pattern represents central atrophy,
or normal pressure hydrocephalus. No acute or chronic cortical
infarct. No mass lesion, hemorrhage, or extra-axial fluid.

Vascular: Calcification of the cavernous internal carotid arteries
consistent with cerebrovascular atherosclerotic disease. No signs of
intracranial large vessel occlusion.

Skull: Calvarium intact.

Sinuses/Orbits: Sinuses are clear. Negative orbits.

Other: None.
IMPRESSION: 1. Moderate to advanced ventriculomegaly. See discussion above.
Findings could represent either central atrophy or normal pressure
hydrocephalus.
2. No acute intracranial findings.

## 2019-10-29 ENCOUNTER — Inpatient Hospital Stay
Admission: EM | Admit: 2019-10-29 | Discharge: 2019-11-02 | DRG: 690 | Disposition: A | Discharge status: Skilled Nursing Facility | Payer: Medicare Other | Attending: Internal Medicine | Admitting: Internal Medicine

## 2019-10-29 ENCOUNTER — Emergency Department: Payer: Medicare Other

## 2019-10-29 ENCOUNTER — Other Ambulatory Visit: Payer: Self-pay

## 2019-10-29 ENCOUNTER — Encounter: Payer: Self-pay | Admitting: Emergency Medicine

## 2019-10-29 DIAGNOSIS — J45909 Unspecified asthma, uncomplicated: Secondary | ICD-10-CM | POA: Diagnosis present

## 2019-10-29 DIAGNOSIS — H9190 Unspecified hearing loss, unspecified ear: Secondary | ICD-10-CM | POA: Diagnosis present

## 2019-10-29 DIAGNOSIS — Z681 Body mass index (BMI) 19 or less, adult: Secondary | ICD-10-CM

## 2019-10-29 DIAGNOSIS — N390 Urinary tract infection, site not specified: Principal | ICD-10-CM | POA: Diagnosis present

## 2019-10-29 DIAGNOSIS — R627 Adult failure to thrive: Secondary | ICD-10-CM | POA: Diagnosis present

## 2019-10-29 DIAGNOSIS — R531 Weakness: Secondary | ICD-10-CM

## 2019-10-29 DIAGNOSIS — Z20822 Contact with and (suspected) exposure to covid-19: Secondary | ICD-10-CM | POA: Diagnosis present

## 2019-10-29 DIAGNOSIS — L89312 Pressure ulcer of right buttock, stage 2: Secondary | ICD-10-CM | POA: Diagnosis present

## 2019-10-29 DIAGNOSIS — G629 Polyneuropathy, unspecified: Secondary | ICD-10-CM | POA: Diagnosis present

## 2019-10-29 DIAGNOSIS — L8990 Pressure ulcer of unspecified site, unspecified stage: Secondary | ICD-10-CM | POA: Insufficient documentation

## 2019-10-29 DIAGNOSIS — Z66 Do not resuscitate: Secondary | ICD-10-CM | POA: Diagnosis present

## 2019-10-29 DIAGNOSIS — B9620 Unspecified Escherichia coli [E. coli] as the cause of diseases classified elsewhere: Secondary | ICD-10-CM | POA: Diagnosis present

## 2019-10-29 DIAGNOSIS — L89151 Pressure ulcer of sacral region, stage 1: Secondary | ICD-10-CM | POA: Diagnosis present

## 2019-10-29 DIAGNOSIS — E860 Dehydration: Secondary | ICD-10-CM | POA: Diagnosis present

## 2019-10-29 DIAGNOSIS — I10 Essential (primary) hypertension: Secondary | ICD-10-CM | POA: Diagnosis present

## 2019-10-29 DIAGNOSIS — E785 Hyperlipidemia, unspecified: Secondary | ICD-10-CM | POA: Diagnosis present

## 2019-10-29 HISTORY — DX: Essential (primary) hypertension: I10

## 2019-10-29 LAB — COMPREHENSIVE METABOLIC PANEL
ALT: 23 U/L (ref 0–44)
AST: 32 U/L (ref 15–41)
Albumin: 3.1 g/dL — ABNORMAL LOW (ref 3.5–5.0)
Alkaline Phosphatase: 100 U/L (ref 38–126)
Anion gap: 6 (ref 5–15)
BUN: 31 mg/dL — ABNORMAL HIGH (ref 8–23)
CO2: 30 mmol/L (ref 22–32)
Calcium: 9.3 mg/dL (ref 8.9–10.3)
Chloride: 108 mmol/L (ref 98–111)
Creatinine, Ser: 0.72 mg/dL (ref 0.44–1.00)
GFR calc Af Amer: >60 mL/min (ref >60)
GFR calc non Af Amer: >60 mL/min (ref >60)
Glucose, Bld: 167 mg/dL — ABNORMAL HIGH (ref 70–99)
Potassium: 3.9 mmol/L (ref 3.5–5.1)
Sodium: 144 mmol/L (ref 135–145)
Total Bilirubin: 0.8 mg/dL (ref 0.3–1.2)
Total Protein: 6.6 g/dL (ref 6.5–8.1)

## 2019-10-29 LAB — RESPIRATORY PANEL BY RT PCR (FLU A&B, COVID)
Influenza A by PCR: NEGATIVE
Influenza B by PCR: NEGATIVE
SARS Coronavirus 2 by RT PCR: NEGATIVE

## 2019-10-29 LAB — CBC WITH DIFFERENTIAL/PLATELET
Abs Immature Granulocytes: 0.02 10*3/uL (ref 0.00–0.07)
Basophils Absolute: 0 10*3/uL (ref 0.0–0.1)
Basophils Relative: 1 %
Eosinophils Absolute: 0.2 10*3/uL (ref 0.0–0.5)
Eosinophils Relative: 3 %
HCT: 39.5 % (ref 36.0–46.0)
Hemoglobin: 12.1 g/dL (ref 12.0–15.0)
Immature Granulocytes: 0 %
Lymphocytes Relative: 23 %
Lymphs Abs: 1.6 10*3/uL (ref 0.7–4.0)
MCH: 29.5 pg (ref 26.0–34.0)
MCHC: 30.6 g/dL (ref 30.0–36.0)
MCV: 96.3 fL (ref 80.0–100.0)
Monocytes Absolute: 0.7 10*3/uL (ref 0.1–1.0)
Monocytes Relative: 11 %
Neutro Abs: 4.4 10*3/uL (ref 1.7–7.7)
Neutrophils Relative %: 62 %
Platelets: 271 10*3/uL (ref 150–400)
RBC: 4.1 MIL/uL (ref 3.87–5.11)
RDW: 13.1 % (ref 11.5–15.5)
WBC: 6.9 10*3/uL (ref 4.0–10.5)
nRBC: 0 % (ref 0.0–0.2)

## 2019-10-29 LAB — LACTATE DEHYDROGENASE: LDH: 193 U/L — ABNORMAL HIGH (ref 98–192)

## 2019-10-29 LAB — FERRITIN: Ferritin: 58 ng/mL (ref 11–307)

## 2019-10-29 MED ORDER — SODIUM CHLORIDE 0.9 % IV BOLUS
1000.0000 mL | Freq: Once | INTRAVENOUS | Status: AC
  Administered 2019-10-29: 1000 mL via INTRAVENOUS

## 2019-10-29 MED ORDER — SODIUM CHLORIDE 0.9 % IV SOLN: Freq: Once | INTRAVENOUS | Status: AC

## 2019-10-29 MED ORDER — SODIUM CHLORIDE 0.9 % IV SOLN
1.0000 g | Freq: Once | INTRAVENOUS | Status: AC
  Administered 2019-10-29: 1 g via INTRAVENOUS
  Filled 2019-10-29: qty 10

## 2019-10-29 NOTE — ED Triage Notes (Signed)
Pt presents to ED via [facility] from home c/o generalized weakness x1-2 weeks. Pt states she is usually able to get up and ambulate to bathroom independently but has needed in-home help the last week. Reports exposure to daughter-in-law who was recently admitted for Covid. O2 sat 90% on RA, pt placed on 2L n/c.

## 2019-10-29 NOTE — ED Notes (Signed)
Pt clothes and depends noted to be soaked in urine. Pericare done, depends applied, and soiled clothes removed.

## 2019-10-29 NOTE — ED Provider Notes (Signed)
[facility] Emergency Department Provider Note  ____________________________________________   First MD Initiated Contact with Patient 10/29/19 1819     (approximate)  I have reviewed the triage vital signs and the nursing notes.  History  Chief Complaint Weakness    HPI Denise Escobar is a 86 y.o. female with history of hypertension who presents emergency department for generalized weakness.  Patient states symptoms have been ongoing and progressively worsening over the last week.  She has now become so weak that she cannot get up from the toilet herself.  She lives at home with her son who typically helps take care of her, but with his chronic back pain and her progressive weakness he has been unable to help care for her.  They have nurses visit several times throughout the week, who are now having to help clean and bathe her, which is not normal for her. She arrives with her clothes/depends soiled in urine.  She denies any fevers, cough, difficulty breathing, abdominal pain, nausea, vomiting, diarrhea, urinary symptoms.  She does report an COVID exposure, her daughter in law recently admitted for [place].  Normally lives at home with her son, ambulates with a roller walker.  Past Medical Hx Past Medical History:  Diagnosis Date  . Hypertension     Problem List Patient Active Problem List   Diagnosis Date Noted  . Delirium 11/08/2018  . Hallucinations 11/08/2018  . Protein-calorie malnutrition, severe 11/07/2018  . Acute encephalopathy 11/06/2018  . Acute pyelonephritis 11/06/2018    Past Surgical Hx Past Surgical History:  Procedure Laterality Date  . HIP SURGERY  1994    Medications Prior to Admission medications   Medication Sig Start Date End Date Taking? Authorizing Provider  calcium-vitamin D (OSCAL WITH D) 250-125 MG-UNIT tablet Take 1 tablet by mouth daily.    [provider]  ezetimibe-simvastatin (VYTORIN) 10-40 MG tablet Take  0.5 tablets by mouth daily. 10/25/18   [provider]  feeding supplement, ENSURE ENLIVE, (ENSURE ENLIVE) LIQD Take 237 mLs by mouth 3 (three) times daily between meals. 11/10/18   Nicholes Mango, MD  gabapentin (NEURONTIN) 600 MG tablet Take 600 mg by mouth 2 (two) times daily. 02/14/17   [provider]  haloperidol (HALDOL) 2 MG/ML solution Take 0.5 mLs (1 mg total) by mouth 2 (two) times daily. Haldol 1 mg 2 times daily and 2 mg at night on daily basis 11/10/18   Gouru, Illene Silver, MD  mirtazapine (REMERON SOL-TAB) 15 MG disintegrating tablet Take 0.5 tablets (7.5 mg total) by mouth at bedtime. 11/10/18   Gouru, Illene Silver, MD  montelukast (SINGULAIR) 10 MG tablet Take 10 mg by mouth daily. 02/14/17   [provider]  Multiple Vitamin (MULTIVITAMIN WITH MINERALS) TABS tablet Take 1 tablet by mouth daily. 11/11/18   Gouru, Illene Silver, MD  TOPROL XL 50 MG 24 hr tablet Take 25 mg by mouth daily. 02/14/17   [provider]  Vitamin D, Ergocalciferol, (DRISDOL) 1.25 MG (50000 UT) CAPS capsule Take 50,000 Units by mouth daily. 09/11/18   [provider]    Allergies Patient has no known allergies.  Family Hx History reviewed. No pertinent family history.  Social Hx Social History   Tobacco Use  . Smoking status: Never Smoker  . Smokeless tobacco: Never Used  Substance Use Topics  . Alcohol use: Not on file  . Drug use: Not on file     Review of Systems  Constitutional: Negative for fever, chills.  Positive for  generalized weakness.   Eyes: Negative for visual changes. ENT: Negative for sore throat. Cardiovascular: Negative for chest pain. Respiratory: Negative for shortness of breath. Gastrointestinal: Negative for nausea, vomiting.  Genitourinary: Negative for dysuria. Musculoskeletal: Negative for leg swelling. Skin: Negative for rash. Neurological: Negative for headaches.   Physical Exam  Vital Signs: ED Triage Vitals [10/29/19 1823]  Enc Vitals  Group     BP 119/69     Pulse Rate 71     Resp 18     Temp 99.4 F (37.4 C)     Temp Source Oral     SpO2 91 %     Weight 120 lb (54.4 kg)     Height 5\' 5"  (1.651 m)     Head Circumference      Peak Flow      Pain Score 0     Pain Loc      Pain Edu?      Excl. in GC?     Constitutional: Alert and oriented.  Frail.  On initial arrival, patient's clothes and depends noted to be soaked in urine. Head: Normocephalic. Atraumatic. Eyes: Conjunctivae clear. Sclera anicteric. Nose: No congestion. No rhinorrhea. Mouth/Throat: Wearing mask.  MM dry. Neck: No stridor.   Cardiovascular: Normal rate, regular rhythm. Extremities well perfused. Respiratory: Normal respiratory effort.  Lungs CTAB. Gastrointestinal: Soft. Non-tender. Non-distended.  Musculoskeletal: No lower extremity edema. No deformities. Neurologic:  Normal speech and language. No gross focal neurologic deficits are appreciated.  Equal strength x 4. Skin: Skin is warm, dry and intact. No rash noted. Psychiatric: Mood and affect are appropriate for situation.    Radiology  CXR: IMPRESSION:  No acute cardiopulmonary findings.    Procedures  Procedure(s) performed (including critical care):  Procedures   Initial Impression / Assessment and Plan / ED Course  86 y.o. female who presents to the ED for generalized weakness, as above.  Ddx: anemia, UTI, electrolyte abnormality, dehydration, COVID  COVID negative.  CXR negative.  Urine sample concerning for infection, malodorous, cloudy with layering sediment.  Unfortunately, initial sample not sufficient for testing.  We will continue to hydrate and resend, though high suspicion remains for UTI.   Final Clinical Impression(s) / ED Diagnosis  Final diagnoses:  Generalized weakness       Note:  This document was prepared using Dragon voice recognition software and may include unintentional dictation errors.   83., MD 10/30/19 11/01/19

## 2019-10-29 NOTE — ED Notes (Signed)
Bladder scan showed 412 ml

## 2019-10-30 ENCOUNTER — Other Ambulatory Visit: Payer: Self-pay

## 2019-10-30 ENCOUNTER — Encounter: Payer: Self-pay | Admitting: Family Medicine

## 2019-10-30 DIAGNOSIS — L8990 Pressure ulcer of unspecified site, unspecified stage: Secondary | ICD-10-CM | POA: Insufficient documentation

## 2019-10-30 DIAGNOSIS — R531 Weakness: Secondary | ICD-10-CM | POA: Diagnosis not present

## 2019-10-30 DIAGNOSIS — L89312 Pressure ulcer of right buttock, stage 2: Secondary | ICD-10-CM | POA: Diagnosis present

## 2019-10-30 DIAGNOSIS — J45909 Unspecified asthma, uncomplicated: Secondary | ICD-10-CM | POA: Diagnosis present

## 2019-10-30 DIAGNOSIS — E785 Hyperlipidemia, unspecified: Secondary | ICD-10-CM

## 2019-10-30 DIAGNOSIS — I10 Essential (primary) hypertension: Secondary | ICD-10-CM | POA: Diagnosis present

## 2019-10-30 DIAGNOSIS — N390 Urinary tract infection, site not specified: Secondary | ICD-10-CM | POA: Diagnosis present

## 2019-10-30 DIAGNOSIS — Z681 Body mass index (BMI) 19 or less, adult: Secondary | ICD-10-CM | POA: Diagnosis not present

## 2019-10-30 DIAGNOSIS — H9190 Unspecified hearing loss, unspecified ear: Secondary | ICD-10-CM | POA: Diagnosis present

## 2019-10-30 DIAGNOSIS — Z20822 Contact with and (suspected) exposure to covid-19: Secondary | ICD-10-CM | POA: Diagnosis present

## 2019-10-30 DIAGNOSIS — G629 Polyneuropathy, unspecified: Secondary | ICD-10-CM | POA: Diagnosis present

## 2019-10-30 DIAGNOSIS — E860 Dehydration: Secondary | ICD-10-CM

## 2019-10-30 DIAGNOSIS — L89151 Pressure ulcer of sacral region, stage 1: Secondary | ICD-10-CM | POA: Diagnosis present

## 2019-10-30 DIAGNOSIS — R627 Adult failure to thrive: Secondary | ICD-10-CM | POA: Diagnosis present

## 2019-10-30 DIAGNOSIS — B9620 Unspecified Escherichia coli [E. coli] as the cause of diseases classified elsewhere: Secondary | ICD-10-CM | POA: Diagnosis present

## 2019-10-30 DIAGNOSIS — Z66 Do not resuscitate: Secondary | ICD-10-CM | POA: Diagnosis present

## 2019-10-30 LAB — CBC
HCT: 35.7 % — ABNORMAL LOW (ref 36.0–46.0)
Hemoglobin: 11 g/dL — ABNORMAL LOW (ref 12.0–15.0)
MCH: 30.1 pg (ref 26.0–34.0)
MCHC: 30.8 g/dL (ref 30.0–36.0)
MCV: 97.5 fL (ref 80.0–100.0)
Platelets: 210 10*3/uL (ref 150–400)
RBC: 3.66 MIL/uL — ABNORMAL LOW (ref 3.87–5.11)
RDW: 13.2 % (ref 11.5–15.5)
WBC: 5.3 10*3/uL (ref 4.0–10.5)
nRBC: 0 % (ref 0.0–0.2)

## 2019-10-30 LAB — URINALYSIS, COMPLETE (UACMP) WITH MICROSCOPIC
Bilirubin Urine: NEGATIVE
Glucose, UA: NEGATIVE mg/dL
Ketones, ur: NEGATIVE mg/dL
Nitrite: POSITIVE — AB
Protein, ur: NEGATIVE mg/dL
Specific Gravity, Urine: 1.017 (ref 1.005–1.030)
pH: 5 (ref 5.0–8.0)

## 2019-10-30 MED ORDER — ENOXAPARIN SODIUM 40 MG/0.4ML ~~LOC~~ SOLN
40.0000 mg | SUBCUTANEOUS | Status: DC
  Administered 2019-10-30 – 2019-11-02 (×4): 40 mg via SUBCUTANEOUS
  Filled 2019-10-30 (×4): qty 0.4

## 2019-10-30 MED ORDER — MONTELUKAST SODIUM 10 MG PO TABS
10.0000 mg | ORAL_TABLET | Freq: Every day | ORAL | Status: DC
  Administered 2019-10-30 – 2019-11-02 (×4): 10 mg via ORAL
  Filled 2019-10-30 (×4): qty 1

## 2019-10-30 MED ORDER — CALCIUM CARBONATE-VITAMIN D 250-125 MG-UNIT PO TABS: 1.0000 | ORAL_TABLET | Freq: Every day | ORAL | Status: DC

## 2019-10-30 MED ORDER — GABAPENTIN 600 MG PO TABS: 600.0000 mg | ORAL_TABLET | Freq: Two times a day (BID) | ORAL | Status: DC

## 2019-10-30 MED ORDER — METOPROLOL SUCCINATE ER 25 MG PO TB24
25.0000 mg | ORAL_TABLET | Freq: Every day | ORAL | Status: DC
  Administered 2019-10-31 – 2019-11-02 (×3): 25 mg via ORAL
  Filled 2019-10-30 (×4): qty 1

## 2019-10-30 MED ORDER — SODIUM CHLORIDE 0.9 % IV SOLN: INTRAVENOUS | Status: DC

## 2019-10-30 MED ORDER — ONDANSETRON HCL 4 MG PO TABS: 4.0000 mg | ORAL_TABLET | Freq: Four times a day (QID) | ORAL | Status: DC | PRN

## 2019-10-30 MED ORDER — ENSURE ENLIVE PO LIQD
237.0000 mL | Freq: Three times a day (TID) | ORAL | Status: DC
  Administered 2019-10-30 – 2019-11-02 (×9): 237 mL via ORAL

## 2019-10-30 MED ORDER — ACETAMINOPHEN 325 MG PO TABS: 650.0000 mg | ORAL_TABLET | Freq: Four times a day (QID) | ORAL | Status: DC | PRN

## 2019-10-30 MED ORDER — IPRATROPIUM-ALBUTEROL 0.5-2.5 (3) MG/3ML IN SOLN: 3.0000 mL | RESPIRATORY_TRACT | Status: DC | PRN

## 2019-10-30 MED ORDER — ACETAMINOPHEN 650 MG RE SUPP: 650.0000 mg | Freq: Four times a day (QID) | RECTAL | Status: DC | PRN

## 2019-10-30 MED ORDER — SIMVASTATIN 20 MG PO TABS
20.0000 mg | ORAL_TABLET | Freq: Every day | ORAL | Status: DC
  Administered 2019-10-30 – 2019-11-01 (×3): 20 mg via ORAL
  Filled 2019-10-30 (×4): qty 1

## 2019-10-30 MED ORDER — VITAMIN D (ERGOCALCIFEROL) 1.25 MG (50000 UNIT) PO CAPS
50000.0000 [IU] | ORAL_CAPSULE | ORAL | Status: DC
  Administered 2019-11-02: 50000 [IU] via ORAL
  Filled 2019-10-30: qty 1

## 2019-10-30 MED ORDER — SODIUM CHLORIDE 0.9 % IV SOLN
1.0000 g | INTRAVENOUS | Status: DC
  Administered 2019-10-30 – 2019-10-31 (×2): 1 g via INTRAVENOUS
  Filled 2019-10-30 (×2): qty 10
  Filled 2019-10-30: qty 1

## 2019-10-30 MED ORDER — EZETIMIBE 10 MG PO TABS
5.0000 mg | ORAL_TABLET | Freq: Every day | ORAL | Status: DC
  Administered 2019-10-30 – 2019-11-01 (×3): 5 mg via ORAL
  Filled 2019-10-30 (×4): qty 0.5

## 2019-10-30 MED ORDER — MAGNESIUM HYDROXIDE 400 MG/5ML PO SUSP: 30.0000 mL | Freq: Every day | ORAL | Status: DC | PRN

## 2019-10-30 MED ORDER — ONDANSETRON HCL 4 MG/2ML IJ SOLN: 4.0000 mg | Freq: Four times a day (QID) | INTRAMUSCULAR | Status: DC | PRN

## 2019-10-30 MED ORDER — EZETIMIBE-SIMVASTATIN 10-40 MG PO TABS: 0.5000 | ORAL_TABLET | Freq: Every day | ORAL | Status: DC

## 2019-10-30 MED ORDER — TRAZODONE HCL 50 MG PO TABS: 25.0000 mg | ORAL_TABLET | Freq: Every evening | ORAL | Status: DC | PRN

## 2019-10-30 NOTE — ED Notes (Signed)
Attempted to call report to 2A, they will call back (trying to locate a telemetry box)

## 2019-10-30 NOTE — H&P (Signed)
[place] [facility]   PATIENT NAME: Denise Escobar    MR#:  [id number]  DATE OF BIRTH:  1932-09-09  DATE OF ADMISSION:  10/29/2019  PRIMARY CARE PHYSICIAN: Alan Mulder, MD   REQUESTING/REFERRING PHYSICIAN: Paschal Dopp, MD  CHIEF COMPLAINT:   Chief Complaint  Patient presents with  . Weakness    HISTORY OF PRESENT ILLNESS:  Denise Escobar  is a 86 y.o. Caucasian female with a known history of hypertension, asthma and hypertension, who presented to the emergency room with acute onset of generalized weakness with inability to ambulate over the last several days to the point that she was not able to get up from the toilet by herself.  She lives with her son who helps her.  She has home health visits throughout the week and lately they have been cleaning and bathing her which is unusual for her.  She arrived to the emergency room with her clothes soaked with urine.  No reported fever or chills dyspnea or cough or wheezing or hemoptysis.  She is a very poor historian partly due to hearing loss.  Her daughter-in-law was recently admitted for COVID-19.  She has been exposed to her.  At baseline she ambulates with a rolling walker.  When she came to the ER blood pressure was 141/75 with otherwise normal vital signs.  Labs revealed albumin of 3.1 and LDH of 193 with ferritin of 58 with normal CBC unremarkable CMP otherwise.  Influenza a and B antigen and 2-hour PCR came back negative for COVID-19.  Urinalysis showed 21-50 WBCs and rare bacteria with positive nitrite.  Chest x-ray showed no acute cardiopulmonary disease.  The patient was given a gram of IV Rocephin and Rocephin and hydration with 2 L bolus followed 100 mill per hour.  Urine culture was sent.  She will be admitted to a medical monitored bed for further evaluation and management.  PAST MEDICAL HISTORY:   Past Medical History:  Diagnosis Date  . Hypertension   , Asthma, peripheral neuropathy, dyslipidemia.   PAST  SURGICAL HISTORY:   Past Surgical History:  Procedure Laterality Date  . HIP SURGERY  1994    SOCIAL HISTORY:   Social History   Tobacco Use  . Smoking status: Never Smoker  . Smokeless tobacco: Never Used  Substance Use Topics  . Alcohol use: Not on file    FAMILY HISTORY:  History reviewed. No pertinent family history.  DRUG ALLERGIES:  No Known Allergies  REVIEW OF SYSTEMS:   ROS As per history of present illness. All pertinent systems were reviewed above. Constitutional,  HEENT, cardiovascular, respiratory, GI, GU, musculoskeletal, neuro, psychiatric, endocrine,  integumentary and hematologic systems were reviewed and are otherwise  negative/unremarkable except for positive findings mentioned above in the HPI.   MEDICATIONS AT HOME:   Prior to Admission medications   Medication Sig Start Date End Date Taking? Authorizing Provider  calcium-vitamin D (OSCAL WITH D) 250-125 MG-UNIT tablet Take 1 tablet by mouth daily.    [provider]  ezetimibe-simvastatin (VYTORIN) 10-40 MG tablet Take 0.5 tablets by mouth daily. 10/25/18   [provider]  feeding supplement, ENSURE ENLIVE, (ENSURE ENLIVE) LIQD Take 237 mLs by mouth 3 (three) times daily between meals. 11/10/18   Ramonita Lab, MD  gabapentin (NEURONTIN) 600 MG tablet Take 600 mg by mouth 2 (two) times daily. 02/14/17   [provider]  haloperidol (HALDOL) 2 MG/ML solution Take 0.5 mLs (1 mg total) by mouth 2 (  two) times daily. Haldol 1 mg 2 times daily and 2 mg at night on daily basis 11/10/18   Gouru, Deanna Artis, MD  mirtazapine (REMERON SOL-TAB) 15 MG disintegrating tablet Take 0.5 tablets (7.5 mg total) by mouth at bedtime. 11/10/18   Gouru, Deanna Artis, MD  montelukast (SINGULAIR) 10 MG tablet Take 10 mg by mouth daily. 02/14/17   [provider]  Multiple Vitamin (MULTIVITAMIN WITH MINERALS) TABS tablet Take 1 tablet by mouth daily. 11/11/18   Gouru, Deanna Artis, MD  TOPROL XL 50 MG 24 hr tablet  Take 25 mg by mouth daily. 02/14/17   [provider]  Vitamin D, Ergocalciferol, (DRISDOL) 1.25 MG (50000 UT) CAPS capsule Take 50,000 Units by mouth daily. 09/11/18   [provider]      VITAL SIGNS:  Blood pressure 136/75, pulse (!) 33, temperature 99.4 F (37.4 C), temperature source Oral, resp. rate (!) 21, height 5\' 5"  (1.651 m), weight 54.4 kg, SpO2 100 %.  PHYSICAL EXAMINATION:  Physical Exam  GENERAL:  86 y.o.-year-old Caucasian female patient lying in the bed with no acute distress.  EYES: Pupils equal, round, reactive to light and accommodation. No scleral icterus. Extraocular muscles intact.  HEENT: Head atraumatic, normocephalic. Oropharynx and nasopharynx clear.  NECK:  Supple, no jugular venous distention. No thyroid enlargement, no tenderness.  LUNGS: Normal breath sounds bilaterally, no wheezing, rales,rhonchi or crepitation. No use of accessory muscles of respiration.  CARDIOVASCULAR: Regular rate and rhythm, S1, S2 normal. No murmurs, rubs, or gallops.  ABDOMEN: Soft, nondistended, nontender. Bowel sounds present. No organomegaly or mass.  EXTREMITIES: No pedal edema, cyanosis, or clubbing.  NEUROLOGIC: Cranial nerves II through XII are intact. Muscle strength 5/5 in all extremities. Sensation intact. Gait not checked.  PSYCHIATRIC: The patient is alert and oriented x 3.  Normal affect and good eye contact. SKIN: No obvious rash, lesion, or ulcer.   LABORATORY PANEL:   CBC Recent Labs  Lab 10/29/19 1835  WBC 6.9  HGB 12.1  HCT 39.5  PLT 271   ------------------------------------------------------------------------------------------------------------------  Chemistries  Recent Labs  Lab 10/29/19 1835  NA 144  K 3.9  CL 108  CO2 30  GLUCOSE 167*  BUN 31*  CREATININE 0.72  CALCIUM 9.3  AST 32  ALT 23  ALKPHOS 100  BILITOT 0.8    ------------------------------------------------------------------------------------------------------------------  Cardiac Enzymes No results for input(s): TROPONINI in the last 168 hours. ------------------------------------------------------------------------------------------------------------------  RADIOLOGY:  DG Chest Port 1 View  Result Date: 10/29/2019 CLINICAL DATA:  Weakness EXAM: PORTABLE CHEST 1 VIEW COMPARISON:  11/05/2018 FINDINGS: Stable cardiomediastinal contours. Hyperexpanded lungs with chronic biapical scarring. No focal airspace consolidation is seen. No pleural effusion or pneumothorax. Hiatal hernia is noted. Bones appear demineralized. IMPRESSION: No acute cardiopulmonary findings. Electronically Signed   By: 01/04/2019 D.O.   On: 10/29/2019 18:56      IMPRESSION AND PLAN:   1.  Generalized weakness and failure to thrive.  The patient will be admitted to a medical monitored bed she will be placed on hydration with IV normal saline.  Physical therapy consultation will be obtained for further assessment.  2.  UTI with mild azotemia, likely due to volume depletion and dehydration.    Both could be contributing to #1.  She will be continued on IV Rocephin and will follow urine culture and sensitivity.  She will be hydrated with IV normal saline and will follow her BUN.  3.  Dyslipidemia.  Continue statin therapy.  4. Asthma.  We will continue  Singulair and place on as needed DuoNeb's.  5.  Hypertension.  We will continue Toprol-XL  All the records are reviewed and case discussed with ED provider. The plan of care was discussed in details with the patient (and family). I answered all questions. The patient agreed to proceed with the above mentioned plan. Further management will depend upon hospital course.   CODE STATUS: Full code  TOTAL TIME TAKING CARE OF THIS PATIENT: 50 minutes.    Christel Mormon M.D on 10/30/2019 at 1:11 AM  [facility]    From 7 PM-7 AM, contact night-coverage [email or web address]  CC: Primary care physician; Lenard Simmer, MD   Note: This dictation was prepared with Dragon dictation along with smaller phrase technology. Any transcriptional errors that result from this process are unintentional.

## 2019-10-30 NOTE — Progress Notes (Signed)
Brief hospitalist service update note Nonbillable note.  Please see same day H&P for full billable details.  86 year old female with history of hypertension, asthma who presented with acute onset of generalized weakness and inability to ambulate provide self-care.  Found to have evidence of urinary tract infection.  Covid PCR negative.  Plan: Continue IV Rocephin Continue maintenance IV fluids Follow-up urine culture PT consult Discussed POC with patients son Velinda Wrobel [phone]).  All questions answered Patients son confirms that patient is A DO NOT RESUSCITATE  Lolita Patella MD

## 2019-10-30 NOTE — Plan of Care (Signed)
Continuing with plan of care. 

## 2019-10-30 NOTE — Evaluation (Signed)
Physical Therapy Evaluation Patient Details Name: Denise Escobar MRN: [id number] DOB: 15-Jul-1932 Today's Date: 10/30/2019   History of Present Illness  Pt is 86 y.o. female with a known history of asthma and hypertension, HOH, who presented to the emergency room with acute onset of generalized weakness with inability to ambulate over the last several days to the point that she was not able to get up from the toilet by herself.    Clinical Impression  Patient easily woken, HOH, oriented x4. Pt reported PLOF, confirmed with pt consent by speaking with son on the phone. In the last week the patient has needed increased assistance for ADLs and functional mobility. Normally modI for ambulation with rollator, intermittent assist for ADLs. Pt denies falls in the last 6 months.  The patient demonstrated supine to sit with minA and HOB elevated. Pt stated she normally sleeps in her lift chair. Sat EOB for several minutes with fair balance once assisted to midline position. Sit <> stand with RW and minA, minA needed throughout standing and ambulation to chair due to posterior lean, as well as assistance navigating RW. The patient reported and exhibited fatigue once up in the chair with some SOB noted.  Extensive time spent educating pt and family (over the phone) about pt status and discharge recommendations. Overall the patient demonstrated deficits (see "PT Problem List") that impede the patient's functional abilities, safety, and mobility and would benefit from skilled PT intervention. Recommendation is SNF due to acute decline in functional status.      Follow Up Recommendations SNF    Equipment Recommendations  Other (comment)(TBD)    Recommendations for Other Services OT consult     Precautions / Restrictions Precautions Precautions: Fall Restrictions Weight Bearing Restrictions: No      Mobility  Bed Mobility Overal bed mobility: Needs Assistance Bed Mobility: Supine to Sit     Supine  to sit: Min assist;HOB elevated     General bed mobility comments: handheld assist and extended time. Pt usually sleeps in lift chair  Transfers Overall transfer level: Needs assistance Equipment used: Rolling walker (2 wheeled) Transfers: Sit to/from Stand Sit to Stand: Min assist;From elevated surface         General transfer comment: minA throughout transfer  Ambulation/Gait Ambulation/Gait assistance: Min assist Gait Distance (Feet): 2 Feet Assistive device: Rolling walker (2 wheeled)       General Gait Details: minA throughout due to posterior LOB noted. Difficulty navigating RW use in environment as well.  Stairs            Wheelchair Mobility    Modified Rankin (Stroke Patients Only)       Balance Overall balance assessment: Needs assistance Sitting-balance support: Feet supported Sitting balance-Leahy Scale: Fair       Standing balance-Leahy Scale: Poor                               Pertinent Vitals/Pain Pain Assessment: Faces Faces Pain Scale: Hurts a little bit Pain Location: back Pain Descriptors / Indicators: Aching Pain Intervention(s): Monitored during session;Repositioned;Limited activity within patient's tolerance    Home Living Family/patient expects to be discharged to:: Private residence Living Arrangements: Children Available Help at Discharge: Family;Available 24 hours/day Type of Home: House Home Access: Stairs to enter Entrance Stairs-Rails: Right;Left(pt uses rail and cane) Entrance Stairs-Number of Steps: 5-6 Home Layout: One level Home Equipment: Toilet riser;Walker - 4 wheels Additional Comments: denies any falls  Prior Function Level of Independence: Needs assistance   Gait / Transfers Assistance Needed: pt uses rollator like WC as needed; unable to ambulate for the last week.  ADL's / Homemaking Assistance Needed: daughter in law/aide assist with bathing (bird baths) and dressing especially in the last  week        Hand Dominance        Extremity/Trunk Assessment   Upper Extremity Assessment Upper Extremity Assessment: Generalized weakness    Lower Extremity Assessment Lower Extremity Assessment: Generalized weakness    Cervical / Trunk Assessment Cervical / Trunk Assessment: Kyphotic  Communication   Communication: No difficulties  Cognition Arousal/Alertness: Awake/alert Behavior During Therapy: WFL for tasks assessed/performed Overall Cognitive Status: Within Functional Limits for tasks assessed                                        General Comments      Exercises     Assessment/Plan    PT Assessment Patient needs continued PT services  PT Problem List Decreased strength;Decreased mobility;Decreased safety awareness;Decreased range of motion;Decreased activity tolerance;Decreased balance;Decreased knowledge of use of DME       PT Treatment Interventions Therapeutic exercise;DME instruction;Wheelchair mobility training;Gait training;Balance training;Stair training;Neuromuscular re-education;Functional mobility training;Therapeutic activities;Patient/family education    PT Goals (Current goals can be found in the Care Plan section)  Acute Rehab PT Goals Patient Stated Goal: to go home PT Goal Formulation: With patient Time For Goal Achievement: 11/13/19 Potential to Achieve Goals: Fair    Frequency Min 2X/week   Barriers to discharge        Co-evaluation               AM-PAC PT "6 Clicks" Mobility  Outcome Measure Help needed turning from your back to your side while in a flat bed without using bedrails?: A Lot Help needed moving from lying on your back to sitting on the side of a flat bed without using bedrails?: A Lot Help needed moving to and from a bed to a chair (including a wheelchair)?: A Lot Help needed standing up from a chair using your arms (e.g., wheelchair or bedside chair)?: A Lot Help needed to walk in hospital  room?: A Lot Help needed climbing 3-5 steps with a railing? : Total 6 Click Score: 11    End of Session Equipment Utilized During Treatment: Gait belt Activity Tolerance: Patient limited by fatigue Patient left: in chair;with chair alarm set;with call bell/phone within reach Nurse Communication: Mobility status PT Visit Diagnosis: Muscle weakness (generalized) (M62.81);Difficulty in walking, not elsewhere classified (R26.2);Other symptoms and signs involving the nervous system (E17.408)    Time: 1448-1856 PT Time Calculation (min) (ACUTE ONLY): 53 min   Charges:   PT Evaluation $PT Eval Moderate Complexity: 1 Mod PT Treatments $Therapeutic Exercise: 23-37 mins       Olga Coaster PT, DPT 1:40 PM,10/30/19

## 2019-10-31 DIAGNOSIS — L8990 Pressure ulcer of unspecified site, unspecified stage: Secondary | ICD-10-CM

## 2019-10-31 DIAGNOSIS — R627 Adult failure to thrive: Secondary | ICD-10-CM

## 2019-10-31 DIAGNOSIS — I10 Essential (primary) hypertension: Secondary | ICD-10-CM

## 2019-10-31 LAB — URINE CULTURE: Culture: 70000 — AB

## 2019-10-31 LAB — C-REACTIVE PROTEIN: CRP: 0.8 mg/dL (ref <1.0)

## 2019-10-31 NOTE — Progress Notes (Signed)
PROGRESS NOTE    Denise Escobar  YOV:[id number] DOB: [birth year] DOA: 10/29/2019 PCP: Alan Mulder, MD    Brief Narrative:  86 year old female with history of hypertension, asthma who presented with acute onset of generalized weakness and inability to ambulate provide self-care.  Found to have evidence of urinary tract infection.  Covid PCR negative.  1/30: No status changes.  Vital signs remained stable.  Urine culture growing gram-negative rods.  Identification and sensitivities pending.   Assessment & Plan:   Active Problems:   UTI (urinary tract infection)   Pressure injury of skin   Generalized weakness and failure to thrive.  Received IV fluids, will stop at this time Secondary to urinary tract infection PT, OT, TOC consults Dietitian consult Recommend skilled nursing facility  Urinary tract infection Intravascular volume depletion Volume status improved with IV fluids Urine culture with gram-negative rods Continue IV Rocephin Follow cultures for pathogen identification and sensitivities  Dyslipidemia Continue statin therapy.  Asthma continue Singulair  as needed DuoNeb  Hypertension continue Toprol-XL   DVT prophylaxis: Lovenox Code Status: DNR Family Communication: None today Disposition Plan: Nursing facility plan anticipate 24 hours after urine culture finalized  Consultants:   None  Procedures:   None  Antimicrobials:   Rocephin (10/30/2019-   )   Subjective: Seen and examined No acute events overnight No new complaints  Objective: Vitals:   10/30/19 1200 10/31/19 0037 10/31/19 0446 10/31/19 0912  BP: (!) 111/45 (!) 137/59 125/63 (!) 154/76  Pulse: 62 (!) 120 66   Resp: 19 20 20    Temp: 97.7 F (36.5 C) 98.4 F (36.9 C) 97.6 F (36.4 C)   TempSrc: Axillary Oral Oral   SpO2: 98% 93% 94%   Weight:      Height:        Intake/Output Summary (Last 24 hours) at 10/31/2019 1110 Last data filed at 10/31/2019 0710 Gross  per 24 hour  Intake 1333.97 ml  Output 1900 ml  Net -566.03 ml   Filed Weights   10/29/19 1823  Weight: 54.4 kg    Examination:  General exam: Appears calm and comfortable, appears stated age Respiratory system: Bibasilar crackles, normal work of breathing Cardiovascular system: S1, S2, RRR, no murmurs, no pedal edema  gastrointestinal system: Abdomen is nondistended, soft and nontender. No organomegaly or masses felt. Normal bowel sounds heard. Central nervous system: Alert and oriented x2. No focal neurological deficits. Extremities: Decreased muscle strength, muscle atrophy noted Skin: Dry, scattered ecchymosis Psychiatry: Judgement and insight appear normal. Mood & affect appropriate.     Data Reviewed: I have personally reviewed following labs and imaging studies  CBC: Recent Labs  Lab 10/29/19 1835 10/30/19 0508  WBC 6.9 5.3  NEUTROABS 4.4  --   HGB 12.1 11.0*  HCT 39.5 35.7*  MCV 96.3 97.5  PLT 271 210   Basic Metabolic Panel: Recent Labs  Lab 10/29/19 1835  NA 144  K 3.9  CL 108  CO2 30  GLUCOSE 167*  BUN 31*  CREATININE 0.72  CALCIUM 9.3   GFR: Estimated Creatinine Clearance: 44.2 mL/min (by C-G formula based on SCr of 0.72 mg/dL). Liver Function Tests: Recent Labs  Lab 10/29/19 1835  AST 32  ALT 23  ALKPHOS 100  BILITOT 0.8  PROT 6.6  ALBUMIN 3.1*   No results for input(s): LIPASE, AMYLASE in the last 168 hours. No results for input(s): AMMONIA in the last 168 hours. Coagulation Profile: No results for input(s): INR, PROTIME in the last  168 hours. Cardiac Enzymes: No results for input(s): CKTOTAL, CKMB, CKMBINDEX, TROPONINI in the last 168 hours. BNP (last 3 results) No results for input(s): PROBNP in the last 8760 hours. HbA1C: No results for input(s): HGBA1C in the last 72 hours. CBG: No results for input(s): GLUCAP in the last 168 hours. Lipid Profile: No results for input(s): CHOL, HDL, LDLCALC, TRIG, CHOLHDL, LDLDIRECT in the  last 72 hours. Thyroid Function Tests: No results for input(s): TSH, T4TOTAL, FREET4, T3FREE, THYROIDAB in the last 72 hours. Anemia Panel: Recent Labs    10/29/19 1835  FERRITIN 58   Sepsis Labs: No results for input(s): PROCALCITON, LATICACIDVEN in the last 168 hours.  Recent Results (from the past 240 hour(s))  Urine culture     Status: Abnormal   Collection Time: 10/29/19  6:35 PM   Specimen: Urine, Random  Result Value Ref Range Status   Specimen Description   Final    URINE, RANDOM Performed at [facility], [address] [place], [place] [address]    Special Requests   Final    NONE Performed at [facility], [address] [place], [place] [address]    Culture 70,000 COLONIES/mL ESCHERICHIA COLI (A)  Final   Report Status 10/31/2019 FINAL  Final   Organism ID, Bacteria ESCHERICHIA COLI (A)  Final      Susceptibility   Escherichia coli - MIC*    AMPICILLIN 4 SENSITIVE Sensitive     CEFAZOLIN <=4 SENSITIVE Sensitive     CEFTRIAXONE <=0.25 SENSITIVE Sensitive     CIPROFLOXACIN <=0.25 SENSITIVE Sensitive     GENTAMICIN <=1 SENSITIVE Sensitive     IMIPENEM <=0.25 SENSITIVE Sensitive     NITROFURANTOIN <=16 SENSITIVE Sensitive     TRIMETH/SULFA <=20 SENSITIVE Sensitive     AMPICILLIN/SULBACTAM <=2 SENSITIVE Sensitive     PIP/TAZO <=4 SENSITIVE Sensitive     * 70,000 COLONIES/mL ESCHERICHIA COLI  Respiratory Panel by RT PCR (Flu A&B, Covid) - Nasopharyngeal Swab     Status: None   Collection Time: 10/29/19  6:35 PM   Specimen: Nasopharyngeal Swab  Result Value Ref Range Status   SARS Coronavirus 2 by RT PCR NEGATIVE NEGATIVE Final    Comment: (NOTE) SARS-CoV-2 target nucleic acids are NOT DETECTED. The SARS-CoV-2 RNA is generally detectable in upper respiratoy specimens during the acute phase of infection. The lowest concentration of SARS-CoV-2 viral copies this assay can detect is 131 copies/mL. A negative result does not preclude  SARS-Cov-2 infection and should not be used as the sole basis for treatment or other patient management decisions. A negative result may occur with  improper specimen collection/handling, submission of specimen other than nasopharyngeal swab, presence of viral mutation(s) within the areas targeted by this assay, and inadequate number of viral copies (<131 copies/mL). A negative result must be combined with clinical observations, patient history, and epidemiological information. The expected result is Negative. Fact Sheet for Patients:  https://www.moore.com/ Fact Sheet for Healthcare Providers:  https://www.young.biz/ This test is not yet ap proved or cleared by the [place] FDA and  has been authorized for detection and/or diagnosis of SARS-CoV-2 by FDA under an Emergency Use Authorization (EUA). This EUA will remain  in effect (meaning this test can be used) for the duration of the COVID-19 declaration under Section 564(b)(1) of the Act, 21 U.S.C. section 360bbb-3(b)(1), unless the authorization is terminated or revoked sooner.    Influenza A by PCR NEGATIVE NEGATIVE Final   Influenza B by PCR NEGATIVE NEGATIVE  Final    Comment: (NOTE) The Xpert Xpress SARS-CoV-2/FLU/RSV assay is intended as an aid in  the diagnosis of influenza from Nasopharyngeal swab specimens and  should not be used as a sole basis for treatment. Nasal washings and  aspirates are unacceptable for Xpert Xpress SARS-CoV-2/FLU/RSV  testing. Fact Sheet for Patients: PinkCheek.be Fact Sheet for Healthcare Providers: GravelBags.it This test is not yet approved or cleared by the [place] FDA and  has been authorized for detection and/or diagnosis of SARS-CoV-2 by  FDA under an Emergency Use Authorization (EUA). This EUA will remain  in effect (meaning this test can be used) for the duration of the  Covid-19  declaration under Section 564(b)(1) of the Act, 21  U.S.C. section 360bbb-3(b)(1), unless the authorization is  terminated or revoked. Performed at [facility], [address] [place], [place] [address]          Radiology Studies: DG Chest [place] 1 View  Result Date: 10/29/2019 CLINICAL DATA:  Weakness EXAM: PORTABLE CHEST 1 VIEW COMPARISON:  11/05/2018 FINDINGS: Stable cardiomediastinal contours. Hyperexpanded lungs with chronic biapical scarring. No focal airspace consolidation is seen. No pleural effusion or pneumothorax. Hiatal hernia is noted. Bones appear demineralized. IMPRESSION: No acute cardiopulmonary findings. Electronically Signed   By: Davina Poke D.O.   On: 10/29/2019 18:56        Scheduled Meds: . enoxaparin (LOVENOX) injection  40 mg Subcutaneous Q24H  . ezetimibe  5 mg Oral q1800   And  . simvastatin  20 mg Oral q1800  . feeding supplement (ENSURE ENLIVE)  237 mL Oral TID BM  . metoprolol succinate  25 mg Oral Daily  . montelukast  10 mg Oral Daily  . [START ON 11/02/2019] Vitamin D (Ergocalciferol)  50,000 Units Oral Q7 days   Continuous Infusions: . sodium chloride Stopped (10/30/19 2152)  . cefTRIAXone (ROCEPHIN)  IV Stopped (10/30/19 2222)     LOS: 1 day    Time spent: 35 minutes    Sidney Ace, MD [facility] Pager [phone]  If 7PM-7AM, please contact night-coverage [email or web address] 10/31/2019, 11:10 AM

## 2019-10-31 NOTE — Plan of Care (Signed)
Continuing with plan of care. 

## 2019-11-01 NOTE — NC FL2 (Signed)
[place] LEVEL OF CARE SCREENING TOOL     IDENTIFICATION  Patient Name: Denise Escobar Birthdate: 01-10-1932 Sex: female Admission Date (Current Location): 10/29/2019  [place] and [place] Number:  Engineering geologist and Address:  [facility], [address], [place], [place] [address]      Provider Number: [id number]  Attending Physician Name and Address:  Sidney Ace, MD  Relative Name and Phone Number:       Current Level of Care: Hospital Recommended Level of Care: [place] Prior Approval Number:    Date Approved/Denied:   PASRR Number: [id number]  Discharge Plan: SNF    Current Diagnoses: Patient Active Problem List   Diagnosis Date Noted  . UTI (urinary tract infection) 10/30/2019  . Pressure injury of skin 10/30/2019  . Delirium 11/08/2018  . Hallucinations 11/08/2018  . Protein-calorie malnutrition, severe 11/07/2018  . Acute encephalopathy 11/06/2018  . Acute pyelonephritis 11/06/2018    Orientation RESPIRATION BLADDER Height & Weight     Self, Time, Situation, Place  Normal Incontinent Weight: 54.4 kg Height:  5\' 5"  (165.1 cm)  BEHAVIORAL SYMPTOMS/MOOD NEUROLOGICAL BOWEL NUTRITION STATUS      Incontinent Diet  AMBULATORY STATUS COMMUNICATION OF NEEDS Skin   Limited Assist Verbally Normal                       Personal Care Assistance Level of Assistance  Bathing, Feeding, Dressing Bathing Assistance: Limited assistance Feeding assistance: Limited assistance Dressing Assistance: Limited assistance     Functional Limitations Info  Hearing   Hearing Info: Impaired(Hard of Hearing)      SPECIAL CARE FACTORS FREQUENCY  PT (By licensed PT), OT (By licensed OT)     PT Frequency: min 5xweekly OT Frequency: min 5xweekly            Contractures Contractures Info: Not present    Additional Factors Info                  Current Medications (11/01/2019):  This is the  current hospital active medication list Current Facility-Administered Medications  Medication Dose Route Frequency Provider Last Rate Last Admin  . acetaminophen (TYLENOL) tablet 650 mg  650 mg Oral Q6H PRN Mansy, Jan A, MD       Or  . acetaminophen (TYLENOL) suppository 650 mg  650 mg Rectal Q6H PRN Mansy, Jan A, MD      . enoxaparin (LOVENOX) injection 40 mg  40 mg Subcutaneous Q24H Mansy, Jan A, MD   40 mg at 11/01/19 0202  . ezetimibe (ZETIA) tablet 5 mg  5 mg Oral q1800 Mansy, Jan A, MD   5 mg at 10/31/19 1806   And  . simvastatin (ZOCOR) tablet 20 mg  20 mg Oral q1800 Mansy, Jan A, MD   20 mg at 10/31/19 1806  . feeding supplement (ENSURE ENLIVE) (ENSURE ENLIVE) liquid 237 mL  237 mL Oral TID BM Mansy, Jan A, MD   237 mL at 11/01/19 1332  . ipratropium-albuterol (DUONEB) 0.5-2.5 (3) MG/3ML nebulizer solution 3 mL  3 mL Nebulization Q4H PRN Mansy, Jan A, MD      . magnesium hydroxide (MILK OF MAGNESIA) suspension 30 mL  30 mL Oral Daily PRN Mansy, Jan A, MD      . metoprolol succinate (TOPROL-XL) 24 hr tablet 25 mg  25 mg Oral Daily Mansy, Jan A, MD   25 mg at 11/01/19 0916  . montelukast (SINGULAIR)  tablet 10 mg  10 mg Oral Daily Mansy, Jan A, MD   10 mg at 11/01/19 0916  . ondansetron (ZOFRAN) tablet 4 mg  4 mg Oral Q6H PRN Mansy, Jan A, MD       Or  . ondansetron [facility]) injection 4 mg  4 mg Intravenous Q6H PRN Mansy, Jan A, MD      . traZODone (DESYREL) tablet 25 mg  25 mg Oral QHS PRN Mansy, Vernetta Honey, MD      . Melene Muller ON 11/02/2019] Vitamin D (Ergocalciferol) (DRISDOL) capsule 50,000 Units  50,000 Units Oral Q7 days Mansy, Vernetta Honey, MD         Discharge Medications: Please see discharge summary for a list of discharge medications.  Relevant Imaging Results:  Relevant Lab Results:   Additional Information SS#[id number]  Texola Cellar, RN

## 2019-11-01 NOTE — TOC Initial Note (Signed)
Transition of Care [facility]) - Initial/Assessment Note    Patient Details  Name: Denise Escobar MRN: [id number] Date of Birth: 06-11-1932  [facility]) CM/SW Contact:    [place] Cellar, RN Phone Number: 11/01/2019, 3:35 PM  Clinical Narrative:                 Spoke with patients son who is requesting patient be discharged to [facility] for short term rehab services. Patient lives home with her son and his family where they are with her 24/7 and have aides that come assist for a few hours a day. Patient is able to get up for ADLs and handle short steps at baseline.   Expected Discharge Plan: Skilled Nursing Facility     Patient Goals and CMS Choice Patient states their goals for this hospitalization and ongoing recovery are:: Get stronger and get back home      Expected Discharge Plan and Services Expected Discharge Plan: Skilled Nursing Facility       Living arrangements for the past 2 months: Single Family Home                                      Prior Living Arrangements/Services Living arrangements for the past 2 months: Single Family Home Lives with:: Adult Children Patient language and need for interpreter reviewed:: Yes Do you feel safe going back to the place where you live?: Yes      Need for Family Participation in Patient Care: Yes (Comment) Care giver support system in place?: Yes (comment) Current home services: Homehealth aide, DME(walker, wheelchair, aide services a few hours at a time) Criminal Activity/Legal Involvement Pertinent to Current Situation/Hospitalization: No - Comment as needed  Activities of Daily Living Home Assistive Devices/Equipment: Environmental consultant (specify type), Eyeglasses ADL Screening (condition at time of admission) Patient's cognitive ability adequate to safely complete daily activities?: Yes Is the patient deaf or have difficulty hearing?: Yes Does the patient have difficulty seeing, even when wearing glasses/contacts?: No Does  the patient have difficulty concentrating, remembering, or making decisions?: Yes Patient able to express need for assistance with ADLs?: Yes Does the patient have difficulty dressing or bathing?: Yes Independently performs ADLs?: No Does the patient have difficulty walking or climbing stairs?: Yes Weakness of Legs: Both Weakness of Arms/Hands: Both  Permission Sought/Granted                  Emotional Assessment Appearance:: Appears stated age Attitude/Demeanor/Rapport: Engaged Affect (typically observed): Accepting Orientation: : Oriented to Self, Oriented to Place, Oriented to  Time, Oriented to Situation Alcohol / Substance Use: Never Used Psych Involvement: No (comment)  Admission diagnosis:  UTI (urinary tract infection) [N39.0] Generalized weakness [R53.1] Patient Active Problem List   Diagnosis Date Noted  . UTI (urinary tract infection) 10/30/2019  . Pressure injury of skin 10/30/2019  . Delirium 11/08/2018  . Hallucinations 11/08/2018  . Protein-calorie malnutrition, severe 11/07/2018  . Acute encephalopathy 11/06/2018  . Acute pyelonephritis 11/06/2018   PCP:  Alan Mulder, MD Pharmacy:   [facility] - [address] [place] - [address] [place] [address] Phone: [phone] Fax: [phone]     Social Determinants of Health (SDOH) Interventions    Readmission Risk Interventions No flowsheet data found.

## 2019-11-01 NOTE — Progress Notes (Signed)
PROGRESS NOTE    Denise Escobar  KNL:[id number] DOB: Nov 21, 1931 DOA: 10/29/2019 PCP: Alan Mulder, MD    Brief Narrative:  86 year old female with history of hypertension, asthma who presented with acute onset of generalized weakness and inability to ambulate provide self-care.  Found to have evidence of urinary tract infection.  Covid PCR negative.  1/30: No status changes.  Vital signs remained stable.  Urine culture growing gram-negative rods.  Identification and sensitivities pending.  1/31: Patient seen and examined.  No status changes over interval.  Urine culture resulted.  Pansensitive E. coli.  Patient on day 3 of Rocephin therapy.   Assessment & Plan:   Active Problems:   UTI (urinary tract infection)   Pressure injury of skin   Generalized weakness and failure to thrive.  Received IV fluids, will stop at this time Secondary to urinary tract infection PT, OT, TOC consults Dietitian consult Recommend skilled nursing facility [facility] consult placed.  Urinary tract infection Intravascular volume depletion Volume status improved with IV fluids Urine culture positive for pansensitive E. Coli Continue IV Rocephin, plan for 3-day total antibiotic course Stop after third dose, no further antibiotics  Dyslipidemia Continue statin therapy.  Asthma continue Singulair  as needed DuoNeb  Hypertension continue Toprol-XL   DVT prophylaxis: Lovenox Code Status: DNR Family Communication:   Son Airyanna Dipalma [phone] on 1/31  Disposition Plan: SNF, patient will be medically ready for discharge when she completes the third dose of antibiotics today  Consultants:   None  Procedures:   None  Antimicrobials:   Rocephin (10/30/2019-11/01/19)   Subjective: Seen and examined No acute events overnight No new complaints  Objective: Vitals:   10/31/19 1207 10/31/19 2120 11/01/19 0526 11/01/19 0916  BP: 121/72 125/63 135/63 (!) 149/78  Pulse: 75 73 68  68  Resp: 18 20 20    Temp: 98.1 F (36.7 C) 98 F (36.7 C) 98.1 F (36.7 C)   TempSrc: Oral Oral Oral   SpO2: 97% 92% 94%   Weight:      Height:        Intake/Output Summary (Last 24 hours) at 11/01/2019 1232 Last data filed at 11/01/2019 1100 Gross per 24 hour  Intake 495.19 ml  Output 3850 ml  Net -3354.81 ml   Filed Weights   10/29/19 1823  Weight: 54.4 kg    Examination:  General exam: Appears calm and comfortable, appears stated age Respiratory system: Bibasilar crackles, normal work of breathing Cardiovascular system: S1, S2, RRR, no murmurs, no pedal edema  gastrointestinal system: Abdomen is nondistended, soft and nontender. No organomegaly or masses felt. Normal bowel sounds heard. Central nervous system: Alert and oriented x2. No focal neurological deficits. Extremities: Decreased muscle strength, muscle atrophy noted Skin: Dry, scattered ecchymosis Psychiatry: Judgement and insight appear normal. Mood & affect appropriate.     Data Reviewed: I have personally reviewed following labs and imaging studies  CBC: Recent Labs  Lab 10/29/19 1835 10/30/19 0508  WBC 6.9 5.3  NEUTROABS 4.4  --   HGB 12.1 11.0*  HCT 39.5 35.7*  MCV 96.3 97.5  PLT 271 210   Basic Metabolic Panel: Recent Labs  Lab 10/29/19 1835  NA 144  K 3.9  CL 108  CO2 30  GLUCOSE 167*  BUN 31*  CREATININE 0.72  CALCIUM 9.3   GFR: Estimated Creatinine Clearance: 44.2 mL/min (by C-G formula based on SCr of 0.72 mg/dL). Liver Function Tests: Recent Labs  Lab 10/29/19 1835  AST 32  ALT  23  ALKPHOS 100  BILITOT 0.8  PROT 6.6  ALBUMIN 3.1*   No results for input(s): LIPASE, AMYLASE in the last 168 hours. No results for input(s): AMMONIA in the last 168 hours. Coagulation Profile: No results for input(s): INR, PROTIME in the last 168 hours. Cardiac Enzymes: No results for input(s): CKTOTAL, CKMB, CKMBINDEX, TROPONINI in the last 168 hours. BNP (last 3 results) No results  for input(s): PROBNP in the last 8760 hours. HbA1C: No results for input(s): HGBA1C in the last 72 hours. CBG: No results for input(s): GLUCAP in the last 168 hours. Lipid Profile: No results for input(s): CHOL, HDL, LDLCALC, TRIG, CHOLHDL, LDLDIRECT in the last 72 hours. Thyroid Function Tests: No results for input(s): TSH, T4TOTAL, FREET4, T3FREE, THYROIDAB in the last 72 hours. Anemia Panel: Recent Labs    10/29/19 1835  FERRITIN 58   Sepsis Labs: No results for input(s): PROCALCITON, LATICACIDVEN in the last 168 hours.  Recent Results (from the past 240 hour(s))  Urine culture     Status: Abnormal   Collection Time: 10/29/19  6:35 PM   Specimen: Urine, Random  Result Value Ref Range Status   Specimen Description   Final    URINE, RANDOM Performed at [facility], [address] [place], [place] [address]    Special Requests   Final    NONE Performed at [facility], [address] [place], [place] [address]    Culture 70,000 COLONIES/mL ESCHERICHIA COLI (A)  Final   Report Status 10/31/2019 FINAL  Final   Organism ID, Bacteria ESCHERICHIA COLI (A)  Final      Susceptibility   Escherichia coli - MIC*    AMPICILLIN 4 SENSITIVE Sensitive     CEFAZOLIN <=4 SENSITIVE Sensitive     CEFTRIAXONE <=0.25 SENSITIVE Sensitive     CIPROFLOXACIN <=0.25 SENSITIVE Sensitive     GENTAMICIN <=1 SENSITIVE Sensitive     IMIPENEM <=0.25 SENSITIVE Sensitive     NITROFURANTOIN <=16 SENSITIVE Sensitive     TRIMETH/SULFA <=20 SENSITIVE Sensitive     AMPICILLIN/SULBACTAM <=2 SENSITIVE Sensitive     PIP/TAZO <=4 SENSITIVE Sensitive     * 70,000 COLONIES/mL ESCHERICHIA COLI  Respiratory Panel by RT PCR (Flu A&B, Covid) - Nasopharyngeal Swab     Status: None   Collection Time: 10/29/19  6:35 PM   Specimen: Nasopharyngeal Swab  Result Value Ref Range Status   SARS Coronavirus 2 by RT PCR NEGATIVE NEGATIVE Final    Comment: (NOTE) SARS-CoV-2 target nucleic acids are  NOT DETECTED. The SARS-CoV-2 RNA is generally detectable in upper respiratoy specimens during the acute phase of infection. The lowest concentration of SARS-CoV-2 viral copies this assay can detect is 131 copies/mL. A negative result does not preclude SARS-Cov-2 infection and should not be used as the sole basis for treatment or other patient management decisions. A negative result may occur with  improper specimen collection/handling, submission of specimen other than nasopharyngeal swab, presence of viral mutation(s) within the areas targeted by this assay, and inadequate number of viral copies (<131 copies/mL). A negative result must be combined with clinical observations, patient history, and epidemiological information. The expected result is Negative. Fact Sheet for Patients:  https://www.moore.com/ Fact Sheet for Healthcare Providers:  https://www.young.biz/ This test is not yet ap proved or cleared by the [place] FDA and  has been authorized for detection and/or diagnosis of SARS-CoV-2 by FDA under an Emergency Use Authorization (EUA). This EUA will remain  in effect (meaning this  test can be used) for the duration of the COVID-19 declaration under Section 564(b)(1) of the Act, 21 U.S.C. section 360bbb-3(b)(1), unless the authorization is terminated or revoked sooner.    Influenza A by PCR NEGATIVE NEGATIVE Final   Influenza B by PCR NEGATIVE NEGATIVE Final    Comment: (NOTE) The Xpert Xpress SARS-CoV-2/FLU/RSV assay is intended as an aid in  the diagnosis of influenza from Nasopharyngeal swab specimens and  should not be used as a sole basis for treatment. Nasal washings and  aspirates are unacceptable for Xpert Xpress SARS-CoV-2/FLU/RSV  testing. Fact Sheet for Patients: PinkCheek.be Fact Sheet for Healthcare Providers: GravelBags.it This test is not yet approved or  cleared by the [place] FDA and  has been authorized for detection and/or diagnosis of SARS-CoV-2 by  FDA under an Emergency Use Authorization (EUA). This EUA will remain  in effect (meaning this test can be used) for the duration of the  Covid-19 declaration under Section 564(b)(1) of the Act, 21  U.S.C. section 360bbb-3(b)(1), unless the authorization is  terminated or revoked. Performed at [facility], [address], [place], [place] [address]          Radiology Studies: No results found.      Scheduled Meds: . enoxaparin (LOVENOX) injection  40 mg Subcutaneous Q24H  . ezetimibe  5 mg Oral q1800   And  . simvastatin  20 mg Oral q1800  . feeding supplement (ENSURE ENLIVE)  237 mL Oral TID BM  . metoprolol succinate  25 mg Oral Daily  . montelukast  10 mg Oral Daily  . [START ON 11/02/2019] Vitamin D (Ergocalciferol)  50,000 Units Oral Q7 days   Continuous Infusions: . cefTRIAXone (ROCEPHIN)  IV Stopped (10/31/19 2206)     LOS: 2 days    Time spent: 35 minutes    Sidney Ace, MD [facility] Pager [phone]  If 7PM-7AM, please contact night-coverage 11/01/2019, 12:32 PM

## 2019-11-01 NOTE — Progress Notes (Signed)
Called and spoke to [employer], patient's son . He stated he had brought clothing for patient  And patient was saying that we can't find them. I told Tim we would look and see what we could do to locate belongings. Anselm Jungling

## 2019-11-01 NOTE — Care Management (Addendum)
RN CM attempted to contacted Son Iretta Mangrum [phone] to discuss SNF placement. No answer and no option to leave voicemail.   Received callback from Annette Stable, son, states they are hoping patient can discharge to [facility] of  for short term rehab. RN CM completed Fl2 and PASSR-sent bed request out. Once bed offer made will request insurance authorization.   Incoming call from Nevis @ [facility] wanting to know when patient was exposed. Confirmed with son patient was last exposed to COVID on Jan 26 by the daughter in law.

## 2019-11-02 LAB — RESPIRATORY PANEL BY RT PCR (FLU A&B, COVID)
Influenza A by PCR: NEGATIVE
Influenza B by PCR: NEGATIVE
SARS Coronavirus 2 by RT PCR: NEGATIVE

## 2019-11-02 NOTE — TOC Transition Note (Addendum)
Transition of Care [facility]) - CM/SW Discharge Note   Patient Details  Name: Denise Escobar MRN: [id number] Date of Birth: 1932-07-13  Transition of [facility]) CM/SW Contact:  Chapman Fitch, RN Phone Number: 11/02/2019, 1:52 PM   Clinical Narrative:     Patient medically stable for discharge RNCM confirmed with [facility] they are not able to offer a bed.   Notified son, he was in agreement to extend bed search.  [facility] able to offer a bed.  Son has accepted.   Insurance auth obtained  Reference number [id number]  Discharge information sent in the Marylen Ponto at [facility] notified  Awaiting repeat covid test   Update : Covid test negative.   EMS packet printed.  Bedside RN notified   Final next level of care: Skilled Nursing Facility Barriers to Discharge: No Barriers Identified   Patient Goals and CMS Choice Patient states their goals for this hospitalization and ongoing recovery are:: Get stronger and get back home      Discharge Placement              Patient chooses bed at: ([facility]) Patient to be transferred to facility by: EMS Name of family member notified: Son Patient and family notified of of transfer: 11/02/19  Discharge Plan and Services                                     Social Determinants of Health (SDOH) Interventions     Readmission Risk Interventions No flowsheet data found.

## 2019-11-02 NOTE — Discharge Summary (Signed)
Physician Discharge Summary  Denise Escobar GQQ:[id number] DOB: July 14, 1932 DOA: 10/29/2019  PCP: Alan Mulder, MD  Admit date: 10/29/2019 Discharge date: 11/02/2019  Admitted From: Home Disposition: Skilled nursing facility  Recommendations for Outpatient Follow-up:  1. Follow up with PCP in 1-2 weeks 2.   Home Health: No Equipment/Devices: None Discharge Condition: Stable CODE STATUS: DNR Diet recommendation: Regular  Brief/Interim Summary: 86 year old female with history of hypertension, asthma who presented with acute onset of generalized weakness and inability to ambulate provide self-care.  Found to have evidence of urinary tract infection. Covid PCR negative.  1/30: No status changes.  Vital signs remained stable.  Urine culture growing gram-negative rods.  Identification and sensitivities pending.  1/31: Patient seen and examined.  No status changes over interval.  Urine culture resulted.  Pansensitive E. coli.  Patient on day 3 of Rocephin therapy.  2/1: Patient seen and examined.  Medically stable.  Completed 3 days of IV Rocephin for uncomplicated cystitis.  Medically stable for discharge to skilled nursing facility.  Discharge Diagnoses:  Active Problems:   UTI (urinary tract infection)   Pressure injury of skin  Generalized weakness and failure to thrive.  Received IV fluids, no further IV fluids indicated at this time Secondary to urinary tract infection   Urinary tract infection Intravascular volume depletion Volume status improved with IV fluids Urine culture positive for pansensitive E. Coli Continue IV Rocephin, plan for 3-day total antibiotic course Completed antibiotic course in house No indication to continue at this time  Dyslipidemia Continue statin therapy.  Asthma continue Singulair  as needed DuoNeb  Hypertension continue Toprol-XL    Discharge Instructions  Discharge Instructions    Diet - low sodium heart healthy    Complete by: As directed    Increase activity slowly   Complete by: As directed      Allergies as of 11/02/2019   No Known Allergies     Medication List    TAKE these medications   ezetimibe-simvastatin 10-40 MG tablet Commonly known as: VYTORIN Take 0.5 tablets by mouth daily.   HYDROcodone-acetaminophen 5-325 MG tablet Commonly known as: NORCO/VICODIN TAKE 1 2 TABS BY MOUTH EVERY 6 HOURS AS NEEDED FOR PAIN   montelukast 10 MG tablet Commonly known as: SINGULAIR Take 10 mg by mouth daily.   Toprol XL 50 MG 24 hr tablet Generic drug: metoprolol succinate Take 25 mg by mouth daily.      Contact information for after-discharge care    Destination    [facility] .   Service: Skilled Nursing Contact information: [address] [place]. [place] [address] [phone]             No Known Allergies  Consultations:  None   Procedures/Studies: DG Chest Port 1 View  Result Date: 10/29/2019 CLINICAL DATA:  Weakness EXAM: PORTABLE CHEST 1 VIEW COMPARISON:  11/05/2018 FINDINGS: Stable cardiomediastinal contours. Hyperexpanded lungs with chronic biapical scarring. No focal airspace consolidation is seen. No pleural effusion or pneumothorax. Hiatal hernia is noted. Bones appear demineralized. IMPRESSION: No acute cardiopulmonary findings. Electronically Signed   By: Duanne Guess D.O.   On: 10/29/2019 18:56    (Echo, Carotid, EGD, Colonoscopy, ERCP)    Subjective: Seen and examined on the day of discharge No complaints Medically stable for discharge to skilled nursing facility  Discharge Exam: Vitals:   11/01/19 2215 11/02/19 0547  BP: 137/61 140/62  Pulse: 70 71  Resp: 20 20  Temp: 98 F (36.7 C)  98.2 F (36.8 C)  SpO2: 93% 94%   Vitals:   11/01/19 0916 11/01/19 1412 11/01/19 2215 11/02/19 0547  BP: (!) 149/78 115/62 137/61 140/62  Pulse: 68 74 70 71  Resp:  18 20 20   Temp:  97.7 F (36.5 C) 98 F (36.7 C) 98.2 F (36.8 C)   TempSrc:  Oral Oral Oral  SpO2:  93% 93% 94%  Weight:      Height:        General: Pt is alert, awake, not in acute distress Cardiovascular: RRR, S1/S2 +, no rubs, no gallops Respiratory: CTA bilaterally, no wheezing, no rhonchi Abdominal: Soft, NT, ND, bowel sounds + Extremities: no edema, no cyanosis    The results of significant diagnostics from this hospitalization (including imaging, microbiology, ancillary and laboratory) are listed below for reference.     Microbiology: Recent Results (from the past 240 hour(s))  Urine culture     Status: Abnormal   Collection Time: 10/29/19  6:35 PM   Specimen: Urine, Random  Result Value Ref Range Status   Specimen Description   Final    URINE, RANDOM Performed at [facility], [address] [place], [place] [address]    Special Requests   Final    NONE Performed at [facility], [place], [place] [address]    Culture 70,000 COLONIES/mL ESCHERICHIA COLI (A)  Final   Report Status 10/31/2019 FINAL  Final   Organism ID, Bacteria ESCHERICHIA COLI (A)  Final      Susceptibility   Escherichia coli - MIC*    AMPICILLIN 4 SENSITIVE Sensitive     CEFAZOLIN <=4 SENSITIVE Sensitive     CEFTRIAXONE <=0.25 SENSITIVE Sensitive     CIPROFLOXACIN <=0.25 SENSITIVE Sensitive     GENTAMICIN <=1 SENSITIVE Sensitive     IMIPENEM <=0.25 SENSITIVE Sensitive     NITROFURANTOIN <=16 SENSITIVE Sensitive     TRIMETH/SULFA <=20 SENSITIVE Sensitive     AMPICILLIN/SULBACTAM <=2 SENSITIVE Sensitive     PIP/TAZO <=4 SENSITIVE Sensitive     * 70,000 COLONIES/mL ESCHERICHIA COLI  Respiratory Panel by RT PCR (Flu A&B, Covid) - Nasopharyngeal Swab     Status: None   Collection Time: 10/29/19  6:35 PM   Specimen: Nasopharyngeal Swab  Result Value Ref Range Status   SARS Coronavirus 2 by RT PCR NEGATIVE NEGATIVE Final    Comment: (NOTE) SARS-CoV-2 target nucleic acids are NOT DETECTED. The SARS-CoV-2 RNA is generally  detectable in upper respiratoy specimens during the acute phase of infection. The lowest concentration of SARS-CoV-2 viral copies this assay can detect is 131 copies/mL. A negative result does not preclude SARS-Cov-2 infection and should not be used as the sole basis for treatment or other patient management decisions. A negative result may occur with  improper specimen collection/handling, submission of specimen other than nasopharyngeal swab, presence of viral mutation(s) within the areas targeted by this assay, and inadequate number of viral copies (<131 copies/mL). A negative result must be combined with clinical observations, patient history, and epidemiological information. The expected result is Negative. Fact Sheet for Patients:  PinkCheek.be Fact Sheet for Healthcare Providers:  GravelBags.it This test is not yet ap proved or cleared by the [place] FDA and  has been authorized for detection and/or diagnosis of SARS-CoV-2 by FDA under an Emergency Use Authorization (EUA). This EUA will remain  in effect (meaning this test can be used) for the duration of the COVID-19 declaration under Section 564(b)(1) of the Act,  21 U.S.C. section 360bbb-3(b)(1), unless the authorization is terminated or revoked sooner.    Influenza A by PCR NEGATIVE NEGATIVE Final   Influenza B by PCR NEGATIVE NEGATIVE Final    Comment: (NOTE) The Xpert Xpress SARS-CoV-2/FLU/RSV assay is intended as an aid in  the diagnosis of influenza from Nasopharyngeal swab specimens and  should not be used as a sole basis for treatment. Nasal washings and  aspirates are unacceptable for Xpert Xpress SARS-CoV-2/FLU/RSV  testing. Fact Sheet for Patients: https://www.moore.com/ Fact Sheet for Healthcare Providers: https://www.young.biz/ This test is not yet approved or cleared by the [place] FDA and  has been  authorized for detection and/or diagnosis of SARS-CoV-2 by  FDA under an Emergency Use Authorization (EUA). This EUA will remain  in effect (meaning this test can be used) for the duration of the  Covid-19 declaration under Section 564(b)(1) of the Act, 21  U.S.C. section 360bbb-3(b)(1), unless the authorization is  terminated or revoked. Performed at [facility], [address] [place], [place] [address]      Labs: BNP (last 3 results) No results for input(s): BNP in the last 8760 hours. Basic Metabolic Panel: Recent Labs  Lab 10/29/19 1835  NA 144  K 3.9  CL 108  CO2 30  GLUCOSE 167*  BUN 31*  CREATININE 0.72  CALCIUM 9.3   Liver Function Tests: Recent Labs  Lab 10/29/19 1835  AST 32  ALT 23  ALKPHOS 100  BILITOT 0.8  PROT 6.6  ALBUMIN 3.1*   No results for input(s): LIPASE, AMYLASE in the last 168 hours. No results for input(s): AMMONIA in the last 168 hours. CBC: Recent Labs  Lab 10/29/19 1835 10/30/19 0508  WBC 6.9 5.3  NEUTROABS 4.4  --   HGB 12.1 11.0*  HCT 39.5 35.7*  MCV 96.3 97.5  PLT 271 210   Cardiac Enzymes: No results for input(s): CKTOTAL, CKMB, CKMBINDEX, TROPONINI in the last 168 hours. BNP: Invalid input(s): POCBNP CBG: No results for input(s): GLUCAP in the last 168 hours. D-Dimer No results for input(s): DDIMER in the last 72 hours. Hgb A1c No results for input(s): HGBA1C in the last 72 hours. Lipid Profile No results for input(s): CHOL, HDL, LDLCALC, TRIG, CHOLHDL, LDLDIRECT in the last 72 hours. Thyroid function studies No results for input(s): TSH, T4TOTAL, T3FREE, THYROIDAB in the last 72 hours.  Invalid input(s): FREET3 Anemia work up No results for input(s): VITAMINB12, FOLATE, FERRITIN, TIBC, IRON, RETICCTPCT in the last 72 hours. Urinalysis    Component Value Date/Time   COLORURINE YELLOW (A) 10/29/2019 2358   APPEARANCEUR CLEAR (A) 10/29/2019 2358   LABSPEC 1.017 10/29/2019 2358   PHURINE 5.0  10/29/2019 2358   GLUCOSEU NEGATIVE 10/29/2019 2358   HGBUR SMALL (A) 10/29/2019 2358   BILIRUBINUR NEGATIVE 10/29/2019 2358   KETONESUR NEGATIVE 10/29/2019 2358   PROTEINUR NEGATIVE 10/29/2019 2358   NITRITE POSITIVE (A) 10/29/2019 2358   LEUKOCYTESUR SMALL (A) 10/29/2019 2358   Sepsis Labs Invalid input(s): PROCALCITONIN,  WBC,  LACTICIDVEN Microbiology Recent Results (from the past 240 hour(s))  Urine culture     Status: Abnormal   Collection Time: 10/29/19  6:35 PM   Specimen: Urine, Random  Result Value Ref Range Status   Specimen Description   Final    URINE, RANDOM Performed at [facility], [address] [place], [place] [address]    Special Requests   Final    NONE Performed at [facility], [address] [place],  [place] [address]    Culture 70,000 COLONIES/mL ESCHERICHIA COLI (A)  Final   Report Status 10/31/2019 FINAL  Final   Organism ID, Bacteria ESCHERICHIA COLI (A)  Final      Susceptibility   Escherichia coli - MIC*    AMPICILLIN 4 SENSITIVE Sensitive     CEFAZOLIN <=4 SENSITIVE Sensitive     CEFTRIAXONE <=0.25 SENSITIVE Sensitive     CIPROFLOXACIN <=0.25 SENSITIVE Sensitive     GENTAMICIN <=1 SENSITIVE Sensitive     IMIPENEM <=0.25 SENSITIVE Sensitive     NITROFURANTOIN <=16 SENSITIVE Sensitive     TRIMETH/SULFA <=20 SENSITIVE Sensitive     AMPICILLIN/SULBACTAM <=2 SENSITIVE Sensitive     PIP/TAZO <=4 SENSITIVE Sensitive     * 70,000 COLONIES/mL ESCHERICHIA COLI  Respiratory Panel by RT PCR (Flu A&B, Covid) - Nasopharyngeal Swab     Status: None   Collection Time: 10/29/19  6:35 PM   Specimen: Nasopharyngeal Swab  Result Value Ref Range Status   SARS Coronavirus 2 by RT PCR NEGATIVE NEGATIVE Final    Comment: (NOTE) SARS-CoV-2 target nucleic acids are NOT DETECTED. The SARS-CoV-2 RNA is generally detectable in upper respiratoy specimens during the acute phase of infection. The lowest concentration of SARS-CoV-2 viral copies  this assay can detect is 131 copies/mL. A negative result does not preclude SARS-Cov-2 infection and should not be used as the sole basis for treatment or other patient management decisions. A negative result may occur with  improper specimen collection/handling, submission of specimen other than nasopharyngeal swab, presence of viral mutation(s) within the areas targeted by this assay, and inadequate number of viral copies (<131 copies/mL). A negative result must be combined with clinical observations, patient history, and epidemiological information. The expected result is Negative. Fact Sheet for Patients:  https://www.moore.com/ Fact Sheet for Healthcare Providers:  https://www.young.biz/ This test is not yet ap proved or cleared by the [place] FDA and  has been authorized for detection and/or diagnosis of SARS-CoV-2 by FDA under an Emergency Use Authorization (EUA). This EUA will remain  in effect (meaning this test can be used) for the duration of the COVID-19 declaration under Section 564(b)(1) of the Act, 21 U.S.C. section 360bbb-3(b)(1), unless the authorization is terminated or revoked sooner.    Influenza A by PCR NEGATIVE NEGATIVE Final   Influenza B by PCR NEGATIVE NEGATIVE Final    Comment: (NOTE) The Xpert Xpress SARS-CoV-2/FLU/RSV assay is intended as an aid in  the diagnosis of influenza from Nasopharyngeal swab specimens and  should not be used as a sole basis for treatment. Nasal washings and  aspirates are unacceptable for Xpert Xpress SARS-CoV-2/FLU/RSV  testing. Fact Sheet for Patients: https://www.moore.com/ Fact Sheet for Healthcare Providers: https://www.young.biz/ This test is not yet approved or cleared by the [place] FDA and  has been authorized for detection and/or diagnosis of SARS-CoV-2 by  FDA under an Emergency Use Authorization (EUA). This EUA will remain  in  effect (meaning this test can be used) for the duration of the  Covid-19 declaration under Section 564(b)(1) of the Act, 21  U.S.C. section 360bbb-3(b)(1), unless the authorization is  terminated or revoked. Performed at [facility], [address] [place], [place] [address]      Time coordinating discharge: Over 30 minutes  SIGNED:   Tresa Moore, MD  [facility] 11/02/2019, 11:34 AM Pager   If 7PM-7AM, please contact night-coverage

## 2019-11-02 NOTE — Care Management Important Message (Signed)
Important Message  Patient Details  Name: Denise Escobar MRN: [id number] Date of Birth: 1932-08-27   Medicare Important Message Given:  Yes     Johnell Comings 11/02/2019, 11:34 AM

## 2019-11-02 NOTE — Progress Notes (Signed)
Called report to receiving nurse Gaylyn Rong at [facility], gave number for call back if needed.  Called EMS for transport.  Now awaiting EMS.  Bradly Chris, RN

## 2019-11-02 NOTE — Progress Notes (Signed)
Initial Nutrition Assessment  DOCUMENTATION CODES:   Severe malnutrition in context of chronic illness  INTERVENTION:   Ensure Enlive po TID, each supplement provides 350 kcal and 20 grams of protein  Magic cup TID with meals, each supplement provides 290 kcal and 9 grams of protein  Recommendation daily MVI   Liberalize diet   NUTRITION DIAGNOSIS:   Severe Malnutrition related to social / environmental circumstances(advanced age) as evidenced by severe fat depletion, severe muscle depletion.  GOAL:   Patient will meet greater than or equal to 90% of their needs  MONITOR:   PO intake, Supplement acceptance, Labs, Weight trends, Skin, I & O's  REASON FOR ASSESSMENT:   Consult Assessment of nutrition requirement/status  ASSESSMENT:   86 y/o female with h/o HTN and asthma admitted with UTI and FTT   Met with pt in room today. Pt reports fairly good appetite and oral intake in hospital; pt reports eating a pancake, a sausage patty and a cartoon of milk for breakfast today. Pt also reports drinking 1 Ensure per day. RD will add Magic Cups to meal trays and liberalize pt's diet. Recommend daily MVI to support wound healing. Per chart, pt with weight gain pta. Pt to discharge today.   Medications reviewed and include: lovenox, vitamin D  Labs reviewed: BUN 31(H)  NUTRITION - FOCUSED PHYSICAL EXAM:    Most Recent Value  Orbital Region  Severe depletion  Upper Arm Region  Severe depletion  Thoracic and Lumbar Region  Severe depletion  Buccal Region  Severe depletion  Temple Region  Severe depletion  Clavicle Bone Region  Severe depletion  Clavicle and Acromion Bone Region  Severe depletion  Scapular Bone Region  Severe depletion  Dorsal Hand  Severe depletion  Patellar Region  Severe depletion  Anterior Thigh Region  Severe depletion  Posterior Calf Region  Severe depletion  Edema (RD Assessment)  None  Hair  Reviewed  Eyes  Reviewed  Mouth  Reviewed  Skin   Reviewed  Nails  Reviewed     Diet Order:   Diet Order            DIET DYS 3 Room service appropriate? Yes; Fluid consistency: Thin  Diet effective now        Diet - low sodium heart healthy             EDUCATION NEEDS:   Education needs have been addressed  Skin:  Skin Assessment: Reviewed RN Assessment(ecchymosis, Stage I coccyx, Stage II buttocks)  Last BM:  1/30- type 2  Height:   Ht Readings from Last 1 Encounters:  10/29/19 '5\' 5"'  (1.651 m)    Weight:   Wt Readings from Last 1 Encounters:  10/29/19 54.4 kg    Ideal Body Weight:  56.8 kg  BMI:  Body mass index is 19.97 kg/m.  Estimated Nutritional Needs:   Kcal:  1300-1500kcal/day  Protein:  65-75g/day  Fluid:  >1.2L/day  Koleen Distance MS, RD, LDN Pager #- [phone] Office#- [phone] After Hours Pager: [phone]

## 2019-11-02 NOTE — Progress Notes (Signed)
11/02/2019 3:46 PM  . Lindie Spruce to be D/C'd Home per MD order.  Discussed prescriptions and follow up appointments with the patient. Prescriptions given to patient, medication list explained in detail. Pt verbalized understanding.  Allergies as of 11/02/2019   No Known Allergies     Medication List    TAKE these medications   ezetimibe-simvastatin 10-40 MG tablet Commonly known as: VYTORIN Take 0.5 tablets by mouth daily. Notes to patient: Morning 11/03/19   HYDROcodone-acetaminophen 5-325 MG tablet Commonly known as: NORCO/VICODIN TAKE 1 2 TABS BY MOUTH EVERY 6 HOURS AS NEEDED FOR PAIN Notes to patient: As needed   montelukast 10 MG tablet Commonly known as: SINGULAIR Take 10 mg by mouth daily. Notes to patient: Morning 11/03/19   Toprol XL 50 MG 24 hr tablet Generic drug: metoprolol succinate Take 25 mg by mouth daily. Notes to patient: Morning 11/03/19       Vitals:   11/02/19 0547 11/02/19 1159  BP: 140/62 (!) 110/51  Pulse: 71 69  Resp: 20   Temp: 98.2 F (36.8 C) 98.3 F (36.8 C)  SpO2: 94% 92%    Skin clean, dry and intact without evidence of skin break down, no evidence of skin tears noted. IV catheter discontinued intact. Site without signs and symptoms of complications. Dressing and pressure applied. Pt denies pain at this time. No complaints noted.  An After Visit Summary was printed and given to the patient. Patient escorted via WC, and D/C home via private auto.  Bradly Chris

## 2019-12-08 ENCOUNTER — Encounter: Payer: Self-pay | Admitting: Ophthalmology

## 2019-12-14 ENCOUNTER — Other Ambulatory Visit
Admission: RE | Admit: 2019-12-14 | Discharge: 2019-12-14 | Disposition: A | Discharge status: Home / Self Care | Payer: Medicare Other | Source: Ambulatory Visit | Attending: Ophthalmology | Admitting: Ophthalmology

## 2019-12-14 DIAGNOSIS — Z20822 Contact with and (suspected) exposure to covid-19: Secondary | ICD-10-CM | POA: Diagnosis not present

## 2019-12-14 DIAGNOSIS — Z01812 Encounter for preprocedural laboratory examination: Secondary | ICD-10-CM | POA: Diagnosis present

## 2019-12-14 NOTE — Discharge Instructions (Signed)

## 2019-12-15 LAB — SARS CORONAVIRUS 2 (TAT 6-24 HRS): SARS Coronavirus 2: NEGATIVE

## 2019-12-16 ENCOUNTER — Other Ambulatory Visit: Payer: Self-pay

## 2019-12-16 ENCOUNTER — Encounter: Payer: Self-pay | Admitting: Ophthalmology

## 2019-12-16 ENCOUNTER — Ambulatory Visit
Admission: RE | Admit: 2019-12-16 | Discharge: 2019-12-16 | Disposition: A | Discharge status: Home / Self Care | Payer: Medicare Other | Source: Ambulatory Visit | Attending: Ophthalmology | Admitting: Ophthalmology

## 2019-12-16 ENCOUNTER — Ambulatory Visit: Payer: Medicare Other | Admitting: Anesthesiology

## 2019-12-16 ENCOUNTER — Encounter
Admission: RE | Disposition: A | Discharge status: Home / Self Care | Payer: Self-pay | Source: Ambulatory Visit | Attending: Ophthalmology

## 2019-12-16 DIAGNOSIS — I10 Essential (primary) hypertension: Secondary | ICD-10-CM | POA: Diagnosis not present

## 2019-12-16 DIAGNOSIS — H5703 Miosis: Secondary | ICD-10-CM | POA: Diagnosis not present

## 2019-12-16 DIAGNOSIS — E7800 Pure hypercholesterolemia, unspecified: Secondary | ICD-10-CM | POA: Insufficient documentation

## 2019-12-16 DIAGNOSIS — H9190 Unspecified hearing loss, unspecified ear: Secondary | ICD-10-CM | POA: Insufficient documentation

## 2019-12-16 DIAGNOSIS — H2512 Age-related nuclear cataract, left eye: Secondary | ICD-10-CM | POA: Diagnosis not present

## 2019-12-16 DIAGNOSIS — E785 Hyperlipidemia, unspecified: Secondary | ICD-10-CM | POA: Insufficient documentation

## 2019-12-16 DIAGNOSIS — Z96641 Presence of right artificial hip joint: Secondary | ICD-10-CM | POA: Insufficient documentation

## 2019-12-16 DIAGNOSIS — Z7982 Long term (current) use of aspirin: Secondary | ICD-10-CM | POA: Insufficient documentation

## 2019-12-16 HISTORY — PX: CATARACT EXTRACTION W/PHACO: SHX586

## 2019-12-16 HISTORY — DX: Presence of dental prosthetic device (complete) (partial): Z97.2

## 2019-12-16 HISTORY — DX: Dependence on other enabling machines and devices: Z99.89

## 2019-12-16 HISTORY — DX: Presence of spectacles and contact lenses: Z97.3

## 2019-12-16 SURGERY — PHACOEMULSIFICATION, CATARACT, WITH IOL INSERTION
Anesthesia: Monitor Anesthesia Care | Site: Eye | Laterality: Left

## 2019-12-16 MED ORDER — LIDOCAINE HCL (PF) 2 % IJ SOLN
INTRAOCULAR | Status: DC | PRN
  Administered 2019-12-16: 2 mL

## 2019-12-16 MED ORDER — SODIUM HYALURONATE 23 MG/ML IO SOLN
INTRAOCULAR | Status: DC | PRN
  Administered 2019-12-16: 0.6 mL via INTRAOCULAR

## 2019-12-16 MED ORDER — NA HYALUR & NA CHOND-NA HYALUR 0.4-0.35 ML IO KIT
PACK | INTRAOCULAR | Status: DC | PRN
  Administered 2019-12-16: 1 mL via INTRAOCULAR

## 2019-12-16 MED ORDER — TRYPAN BLUE 0.06 % OP SOLN
OPHTHALMIC | Status: DC | PRN
  Administered 2019-12-16: 0.5 mL via INTRAOCULAR

## 2019-12-16 MED ORDER — BRIMONIDINE TARTRATE-TIMOLOL 0.2-0.5 % OP SOLN
OPHTHALMIC | Status: DC | PRN
  Administered 2019-12-16: 1 [drp] via OPHTHALMIC

## 2019-12-16 MED ORDER — FENTANYL CITRATE (PF) 100 MCG/2ML IJ SOLN
INTRAMUSCULAR | Status: DC | PRN
  Administered 2019-12-16: 25 ug via INTRAVENOUS
  Administered 2019-12-16: 50 ug via INTRAVENOUS
  Administered 2019-12-16: 25 ug via INTRAVENOUS

## 2019-12-16 MED ORDER — CEFUROXIME OPHTHALMIC INJECTION 1 MG/0.1 ML
INJECTION | OPHTHALMIC | Status: DC | PRN
  Administered 2019-12-16: 0.1 mL via INTRACAMERAL

## 2019-12-16 MED ORDER — ARMC OPHTHALMIC DILATING DROPS
1.0000 "application " | OPHTHALMIC | Status: DC | PRN
  Administered 2019-12-16 (×3): 1 via OPHTHALMIC

## 2019-12-16 MED ORDER — LACTATED RINGERS IV SOLN: INTRAVENOUS | Status: DC

## 2019-12-16 MED ORDER — EPINEPHRINE PF 1 MG/ML IJ SOLN
INTRAOCULAR | Status: DC | PRN
  Administered 2019-12-16: 91 mL via OPHTHALMIC

## 2019-12-16 MED ORDER — DEXMEDETOMIDINE HCL 200 MCG/2ML IV SOLN
INTRAVENOUS | Status: DC | PRN
  Administered 2019-12-16: 5 ug via INTRAVENOUS

## 2019-12-16 MED ORDER — TETRACAINE HCL 0.5 % OP SOLN
1.0000 [drp] | OPHTHALMIC | Status: DC | PRN
  Administered 2019-12-16 (×3): 1 [drp] via OPHTHALMIC

## 2019-12-16 MED ORDER — OXYCODONE HCL 5 MG/5ML PO SOLN: 5.0000 mg | Freq: Once | ORAL | Status: DC | PRN

## 2019-12-16 MED ORDER — MOXIFLOXACIN HCL 0.5 % OP SOLN
1.0000 [drp] | OPHTHALMIC | Status: DC | PRN
  Administered 2019-12-16 (×3): 1 [drp] via OPHTHALMIC

## 2019-12-16 MED ORDER — OXYCODONE HCL 5 MG PO TABS: 5.0000 mg | ORAL_TABLET | Freq: Once | ORAL | Status: DC | PRN

## 2019-12-16 SURGICAL SUPPLY — 23 items
CANNULA ANT/CHMB 27G (MISCELLANEOUS) ×1 IMPLANT
CANNULA ANT/CHMB 27GA (MISCELLANEOUS) ×6 IMPLANT
GLOVE SURG LX 7.5 STRW (GLOVE) ×2
GLOVE SURG LX STRL 7.5 STRW (GLOVE) ×1 IMPLANT
GLOVE SURG TRIUMPH 8.0 PF LTX (GLOVE) ×3 IMPLANT
GOWN STRL REUS W/ TWL LRG LVL3 (GOWN DISPOSABLE) ×2 IMPLANT
GOWN STRL REUS W/TWL LRG LVL3 (GOWN DISPOSABLE) ×4
LENS IOL ACRYSOF 31.0 ×2 IMPLANT
MARKER SKIN DUAL TIP RULER LAB (MISCELLANEOUS) ×3 IMPLANT
NDL CAPSULORHEX 25GA (NEEDLE) ×1 IMPLANT
NDL FILTER BLUNT 18X1 1/2 (NEEDLE) ×2 IMPLANT
NEEDLE CAPSULORHEX 25GA (NEEDLE) ×3 IMPLANT
NEEDLE FILTER BLUNT 18X 1/2SAF (NEEDLE) ×4
NEEDLE FILTER BLUNT 18X1 1/2 (NEEDLE) ×2 IMPLANT
PACK CATARACT BRASINGTON (MISCELLANEOUS) ×3 IMPLANT
PACK EYE AFTER SURG (MISCELLANEOUS) ×3 IMPLANT
PACK OPTHALMIC (MISCELLANEOUS) ×3 IMPLANT
RING MALYGIN 7.0 (MISCELLANEOUS) ×2 IMPLANT
SOLUTION OPHTHALMIC SALT (MISCELLANEOUS) ×3 IMPLANT
SYR 3ML LL SCALE MARK (SYRINGE) ×6 IMPLANT
SYR TB 1ML LUER SLIP (SYRINGE) ×3 IMPLANT
WATER STERILE IRR 250ML POUR (IV SOLUTION) ×3 IMPLANT
WIPE NON LINTING 3.25X3.25 (MISCELLANEOUS) ×3 IMPLANT

## 2019-12-16 NOTE — Anesthesia Postprocedure Evaluation (Signed)
Anesthesia Post Note  Patient: Denise Escobar  Procedure(s) Performed: CATARACT EXTRACTION PHACO AND INTRAOCULAR LENS PLACEMENT (IOC) LEFT Vision Blue, MALYUGIN, HEALON 5 19.98 01:44.3 19.1% (Left Eye)     Patient location during evaluation: PACU Anesthesia Type: MAC Level of consciousness: awake and alert Pain management: pain level controlled Vital Signs Assessment: post-procedure vital signs reviewed and stable Respiratory status: spontaneous breathing, nonlabored ventilation, respiratory function stable and patient connected to nasal cannula oxygen Cardiovascular status: stable and blood pressure returned to baseline Postop Assessment: no apparent nausea or vomiting Anesthetic complications: no    Danaysha Kirn

## 2019-12-16 NOTE — Anesthesia Procedure Notes (Signed)
Procedure Name: MAC Date/Time: 12/16/2019 10:56 AM Performed by: Jeannene Patella, CRNA Pre-anesthesia Checklist: Patient identified, Emergency Drugs available, Patient being monitored, Timeout performed and Suction available Patient Re-evaluated:Patient Re-evaluated prior to induction Oxygen Delivery Method: Nasal cannula

## 2019-12-16 NOTE — Op Note (Signed)
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     PROCEDURE:  Phacoemulsification with posterior chamber intraocular lens implantation of the left eye which required pupil stretching with the Malyugin pupil expansion device and Vision Blue dye  Ultrasound time: Procedure(s): CATARACT EXTRACTION PHACO AND INTRAOCULAR LENS PLACEMENT (IOC) LEFT Vision Blue, MALYUGIN, HEALON 5 19.98 01:44.3 19.1% (Left)  LENS:   Implant Name Type Inv. Item Serial No. Manufacturer Lot No. LRB No. Used Action  LENS IOL DIOP ACRYSOF 31.0 - [id number]  LENS IOL DIOP ACRYSOF 31.0 [id number] ALCON  Left 1 Implanted        SURGEON:  Deirdre Evener, MD   ANESTHESIA: Topical with tetracaine drops and 2% Xylocaine jelly, augmented with 1% preservative-free intracameral lidocaine.   COMPLICATIONS:  None.   DESCRIPTION OF PROCEDURE:  The patient was identified in the holding room and transported to the operating room and placed in the supine position under the operating microscope.  The left eye was identified as the operative eye and it was prepped and draped in the usual sterile ophthalmic fashion.   A 1 millimeter clear-corneal paracentesis was made at the 1:30 position.  Lidocaine was placed into the anterior chamber.  Healon 5 was placed into the anterior chamber followed by vision blue dye to stain the l;ens capsule.  This was rinsed out using balanced salt.  Additional Helaon 5 was pllaced into the anterior chamber.  A 2.4 millimeter keratome was used to make a near-clear corneal incision at the 10:30 position.  A Malyugin pupil expander was then placed through the main incision and into the anterior chamber of the eye.  The edge of the iris was secured on the lip of the pupil expander and it was released,  thereby expanding the pupil to approximately 7 millimeters for completion of the cataract surgery.  Additional Viscoat was placed in the anterior chamber.  A cystotome and capsulorrhexis forceps were used to make a curvilinear capsulorrhexis.   Balanced salt solution was used to hydrodissect and hydrodelineate the lens nucleus.   Phacoemulsification was used in stop and chop fashion to remove the lens, nucleus and epinucleus.  The remaining cortex was aspirated using the irrigation aspiration handpiece.  Additional Provisc was placed into the eye to distend the capsular bag for lens placement.  A lens was then injected into the capsular bag.  The pupil expanding ring was removed using a Kuglen hook and insertion device. The remaining viscoelastic was aspirated from the capsular bag and the anterior chamber.  The anterior chamber was filled with balanced salt solution to inflate to a physiologic pressure.   Wounds were hydrated with balanced salt solution.  The anterior chamber was inflated to a physiologic pressure with balanced salt solution.  No wound leaks were noted. Cefuroxime 0.1 ml of a 10mg /ml solution was injected into the anterior chamber for a dose of 1 mg of intracameral antibiotic at the completion of the case.   Timolol and Brimonidine drops were applied to the eye.  The patient was taken to the recovery room in stable condition without complications of anesthesia or surgery.  Alexsandro Salek 12/16/2019, 11:19 AM

## 2019-12-16 NOTE — H&P (Signed)

## 2019-12-16 NOTE — Transfer of Care (Signed)
Immediate Anesthesia Transfer of Care Note  Patient: Denise Escobar  Procedure(s) Performed: CATARACT EXTRACTION PHACO AND INTRAOCULAR LENS PLACEMENT (IOC) LEFT Vision Blue, MALYUGIN, HEALON 5 19.98 01:44.3 19.1% (Left Eye)  Patient Location: PACU  Anesthesia Type: MAC  Level of Consciousness: awake, alert  and patient cooperative  Airway and Oxygen Therapy: Patient Spontanous Breathing and Patient connected to supplemental oxygen  Post-op Assessment: Post-op Vital signs reviewed, Patient's Cardiovascular Status Stable, Respiratory Function Stable, Patent Airway and No signs of Nausea or vomiting  Post-op Vital Signs: Reviewed and stable  Complications: No apparent anesthesia complications

## 2019-12-16 NOTE — Anesthesia Preprocedure Evaluation (Addendum)
Anesthesia Evaluation  Patient identified by MRN, date of birth, ID band Patient awake    Reviewed: NPO status   History of Anesthesia Complications Negative for: history of anesthetic complications  Airway Mallampati: II  TM Distance: >3 FB Neck ROM: full    Dental no notable dental hx. (+) Upper Dentures, Lower Dentures   Pulmonary neg pulmonary ROS,    Pulmonary exam normal        Cardiovascular Exercise Tolerance: Good hypertension, Normal cardiovascular exam  HLD   Neuro/Psych Anxiety HOH DDD     GI/Hepatic negative GI ROS, Neg liver ROS,   Endo/Other  negative endocrine ROS  Renal/GU Recent UTI 11/2019  negative genitourinary   Musculoskeletal  (+) Arthritis ,   Abdominal   Peds  Hematology negative hematology ROS (+)   Anesthesia Other Findings Covid: NEG.  echo:2017: - Left ventricle: The estimated ejection fraction was 65%.  - Aortic valve: There was trivial regurgitation. Valve area (Vmax):  2.79 cm^2.  - Mitral valve: There was mild regurgitation. ;  Pt poor historian; PMH from pt's son.   Reproductive/Obstetrics                            Anesthesia Physical Anesthesia Plan  ASA: II  Anesthesia Plan: MAC   Post-op Pain Management:    Induction:   PONV Risk Score and Plan: 2 and TIVA and Midazolam  Airway Management Planned:   Additional Equipment:   Intra-op Plan:   Post-operative Plan:   Informed Consent: I have reviewed the patients History and Physical, chart, labs and discussed the procedure including the risks, benefits and alternatives for the proposed anesthesia with the patient or authorized representative who has indicated his/her understanding and acceptance.       Plan Discussed with: CRNA  Anesthesia Plan Comments:         Anesthesia Quick Evaluation

## 2019-12-17 ENCOUNTER — Encounter: Payer: Self-pay | Admitting: *Deleted

## 2020-07-20 IMAGING — DX DG CHEST 1V PORT
1 series · 1 of 1 positions shown · non-contrast
Comparison: 11/05/2018

CLINICAL DATA: Weakness

EXAM:
PORTABLE CHEST 1 VIEW

[chest ap]
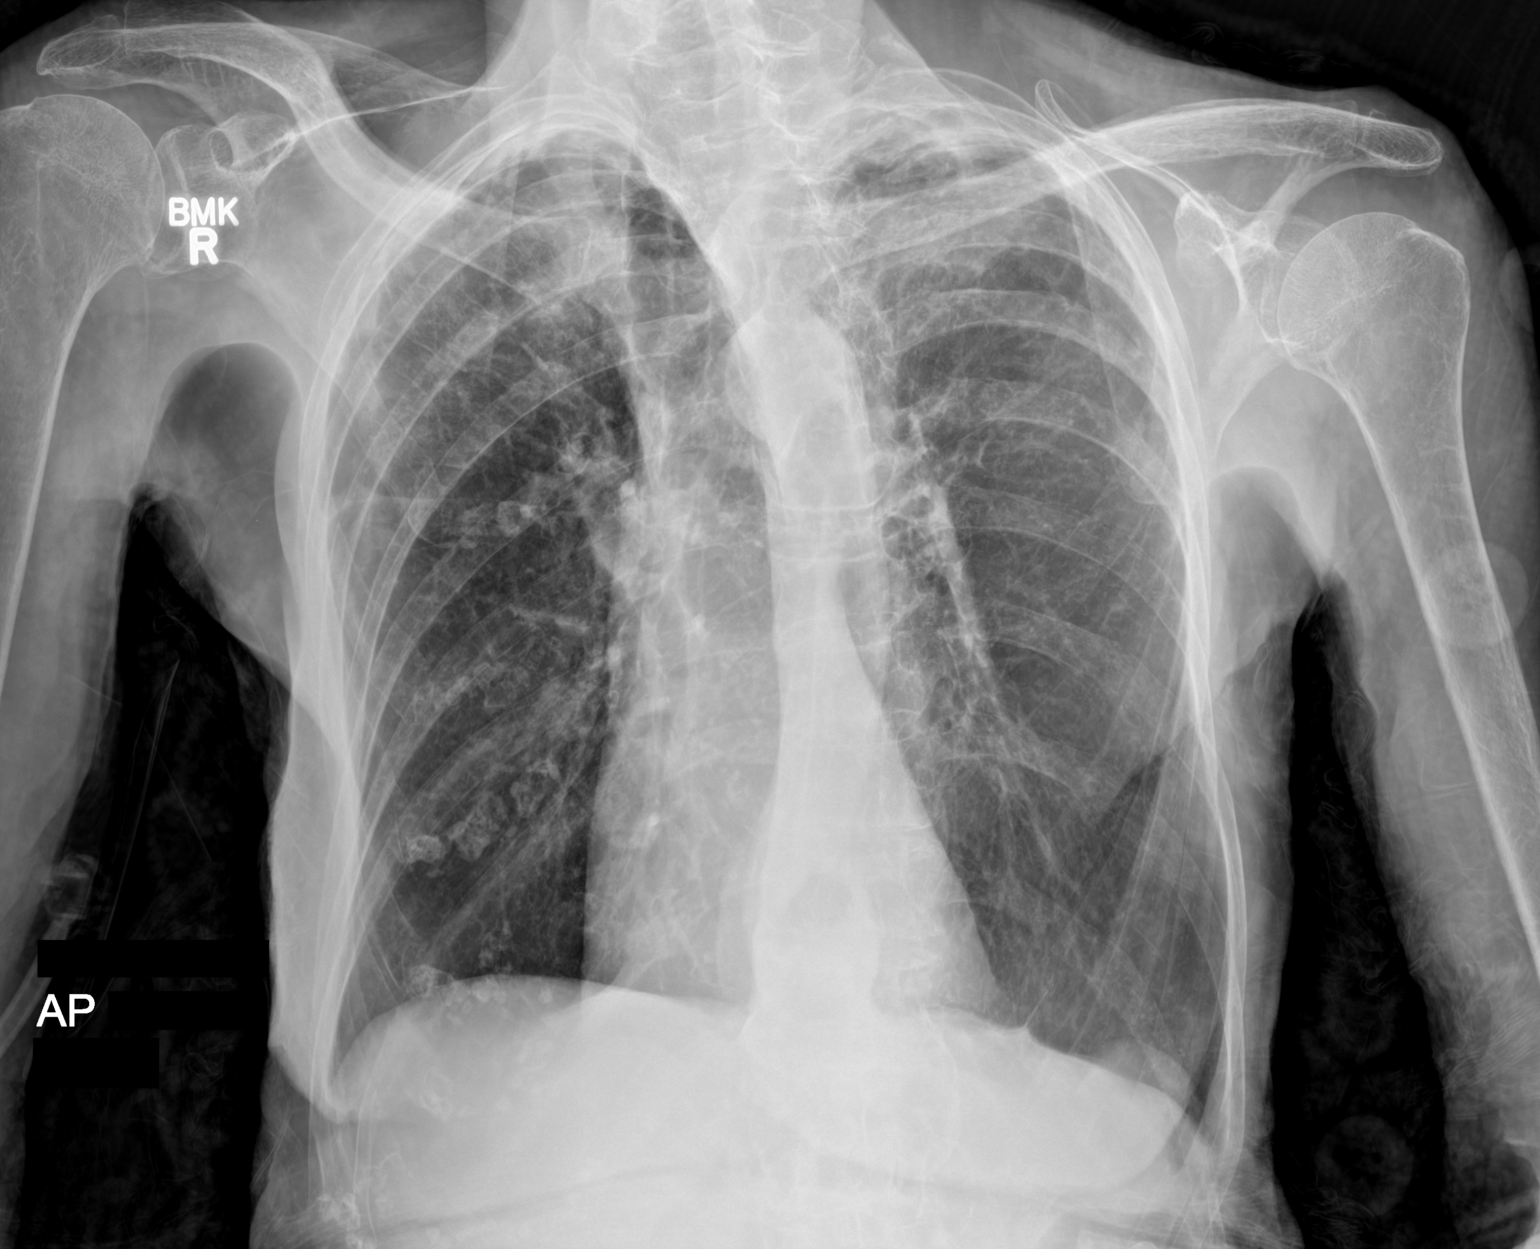

[1 of 1 positions shown; findings below may reference images not displayed]

FINDINGS: Stable cardiomediastinal contours. Hyperexpanded lungs with chronic
biapical scarring. No focal airspace consolidation is seen. No
pleural effusion or pneumothorax. Hiatal hernia is noted. Bones
appear demineralized.
IMPRESSION: No acute cardiopulmonary findings.

## 2021-05-01 DEATH — deceased
